# Patient Record
Sex: Male | Born: 1945 | Race: White | Hispanic: No | Marital: Married | Smoking: Never smoker
Health system: Southern US, Community
[De-identification: ages and names within clinical notes are randomized; demographics above are authoritative.]

## PROBLEM LIST (undated history)

## (undated) DIAGNOSIS — E785 Hyperlipidemia, unspecified: Secondary | ICD-10-CM

## (undated) DIAGNOSIS — I2699 Other pulmonary embolism without acute cor pulmonale: Secondary | ICD-10-CM

## (undated) DIAGNOSIS — F988 Other specified behavioral and emotional disorders with onset usually occurring in childhood and adolescence: Secondary | ICD-10-CM

## (undated) DIAGNOSIS — M1990 Unspecified osteoarthritis, unspecified site: Secondary | ICD-10-CM

## (undated) DIAGNOSIS — C439 Malignant melanoma of skin, unspecified: Secondary | ICD-10-CM

## (undated) DIAGNOSIS — I82409 Acute embolism and thrombosis of unspecified deep veins of unspecified lower extremity: Secondary | ICD-10-CM

## (undated) DIAGNOSIS — N400 Enlarged prostate without lower urinary tract symptoms: Secondary | ICD-10-CM

## (undated) DIAGNOSIS — F32A Depression, unspecified: Secondary | ICD-10-CM

## (undated) DIAGNOSIS — G3184 Mild cognitive impairment, so stated: Secondary | ICD-10-CM

## (undated) HISTORY — DX: Malignant melanoma of skin, unspecified: C43.9

## (undated) HISTORY — DX: Acute embolism and thrombosis of unspecified deep veins of unspecified lower extremity: I82.409

## (undated) HISTORY — DX: Hyperlipidemia, unspecified: E78.5

## (undated) HISTORY — DX: Mild cognitive impairment of uncertain or unknown etiology: G31.84

## (undated) HISTORY — DX: Other pulmonary embolism without acute cor pulmonale: I26.99

## (undated) HISTORY — PX: MELANOMA EXCISION: SHX5266

## (undated) HISTORY — DX: Benign prostatic hyperplasia without lower urinary tract symptoms: N40.0

## (undated) HISTORY — DX: Other specified behavioral and emotional disorders with onset usually occurring in childhood and adolescence: F98.8

## (undated) HISTORY — PX: TONSILLECTOMY: SUR1361

## (undated) HISTORY — PX: ANKLE SURGERY: SHX546

## (undated) HISTORY — DX: Depression, unspecified: F32.A

---

## 1991-11-19 HISTORY — PX: ROTATOR CUFF REPAIR: SHX139

## 1996-11-18 HISTORY — PX: CHOLECYSTECTOMY: SHX55

## 2007-09-15 ENCOUNTER — Encounter: Admission: RE | Admit: 2007-09-15 | Discharge: 2007-09-15 | Payer: Self-pay | Admitting: Orthopedic Surgery

## 2007-10-07 ENCOUNTER — Ambulatory Visit (HOSPITAL_BASED_OUTPATIENT_CLINIC_OR_DEPARTMENT_OTHER): Admission: RE | Admit: 2007-10-07 | Discharge: 2007-10-07 | Payer: Self-pay | Admitting: Orthopedic Surgery

## 2007-10-20 ENCOUNTER — Encounter: Admission: RE | Admit: 2007-10-20 | Discharge: 2007-11-10 | Payer: Self-pay | Admitting: Orthopedic Surgery

## 2011-04-02 NOTE — Op Note (Signed)
NAME:  George Blankenship, George Blankenship NO.:  [id number]   MEDICAL RECORD NO.:  [id number]          PATIENT TYPE:  AMB   LOCATION:  DSC                          FACILITY:  [facility]   PHYSICIAN:  Feliberto Gottron. Turner Daniels, M.D.   DATE OF BIRTH:  1946-06-26   DATE OF PROCEDURE:  10/07/2007  DATE OF DISCHARGE:                               OPERATIVE REPORT   PREOPERATIVE DIAGNOSIS:  Osteochondral loose bodies of the right ankle  and right elbow.   POSTOPERATIVE DIAGNOSES:  1. Osteochondral loose bodies of the right ankle and right elbow.  2. 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  3. 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   PROCEDURE:  Arthroscopic removal of loose bodies and chondromalacia from  the right ankle and the right elbow.   SURGEON:  Feliberto Gottron.  Turner Daniels, MD   FIRST ASSISTANT:  None.   ANESTHETIC:  General LMA.   ESTIMATED BLOOD LOSS:  Minimal.   FLUID REPLACEMENT:  1200 mL crystalloid.   DRAINS PLACED:  None.   TOURNIQUET TIME:  None.   INDICATIONS FOR PROCEDURE:  A very active 65 year old gentleman who is  into martial arts and I believe rodeo riding and has had multiple bumps,  contusions and injuries over the year.  He came into my office reporting  a significant right ankle pain a few weeks ago as well as right elbow  pain when he tries to go to full extension.  Plain radiographs of the  right ankle clearly showed some anterior loose bodies confirmed by CT  scan. The right elbow possibly had a loose body in the olecranon fossa.  We did not do a CT scan because of the findings of the ankle.  He was  going to be getting an anesthetic and it would be relatively simple to  go from the ankle up to the elbow and look on the olecranon fossa and  see if he did in fact have loose bodies.  The CT scan of the ankle did  show a prominent anterior osteophyte off the tibia as well as again  the  osteochondral loose body in the anterior aspect of the joint where he  was having some pain.  Risks and benefits of surgery discussed at  length, questions answered.  His family history is positive for a  postoperative MRSA infection in a daughter which ultimately ended up  killing her and because of that he has requested MRSA  prophylaxis which  was probably reasonable for both medical and psychological reasons in  this case and because of that in the preop area he did receive 1 gram of  vancomycin IV.  Risks and benefits of surgery were discussed at length,  questions answered and he is prepared for surgical intervention.   DESCRIPTION OF PROCEDURE:  The patient identified by armband and taken  to the operating room at [facility] where the appropriate  anesthetic monitors were attached and general LMA anesthesia induced  with the patient in the supine position on a beanbag.  He was then  rolled into the left lateral decubitus position.  The pelvis was  actually only partially rolled up to about 60 degrees and the shoulders  rolled up slightly past vertical to 95 degrees.  At this point the bean  bag was wrapped around him in a U fashion and the air evacuated.  This  left him in an excellent position for both the arthroscopy of the ankle  with the leg externally rotated and the arthroscopy of the elbow which  was draped over a elbow post.  Satisfied with his position tourniquets  were applied to the right calf and right arm but never used and both  extremities were then prepped and draped in usual sterile fashion from  the toes to the tourniquet to the right lower extremity and from the  tourniquet to the wrist and the right upper extremity.   The table was placed in 5 degrees of reversed Trendelenburg and the  ankle distraction clamp was then applied to the table and the ankle  distractor Holter harness applied to the ankle and traction applied.  Using an anterolateral  approach we then filled the ankle with 15 mL of  normal saline solution and made a standard anterolateral portal using an  11 blade to about one half depth.  The ankle arthroscope was then  introduced.  We immediately encountered a large osteochondral loose body  that we made photographic documentation of, but did not remove until  later in the case because when we enlarged the portal obviously we would  loose our compression of the fluid in the ankle.   The patient did in fact have large anterior osteophytes off the tibia.  These were removed with a 2.9 spherical bur.  Chondromalacia of the  distal tibial plafond was also removed with a 2.9 gator sucker shaver  from both the distal tibial plafond and also a little bit of  chondromalacia in the medial and lateral gutters along the medial  malleolus and the lateral malleolar regions.  Small bits of cartilage  were also noted the joint fluid and these were flushed out continuously.   After we had removed the anterior tibial osteophyte we documented  relatively normal cartilage in the tibiotalar joint itself and then  enlarged the anterolateral portal, bringing the scope in from the  anteromedial portal to allow whole removal of the loose body which  measured about a centimeter in diameter.  This was then placed in a  medicine cup.  The wound was irrigated out with normal saline solution  and the anterolateral portal was closed with a single 3-0 nylon  horizontal mattress suture.  A dressing of Xeroform, 4x4 dressing,  sponges, Webril and Ace wrap applied.   We then directed our attention to the right elbow and using an  anterolateral approach this joint was also filled with normal saline  solution and again making an anterolateral portal with an 11 blade to  50% depth the arthroscope was inserted and we made photographic  documentation of grade 3 to four chondromalacia of the radiocapitellar  joint which was debrided with a 2.9 gator  sucker shaver as well as an  anterior loose body which was removed with a pituitary rongeur.  We then  made  posterolateral and straight posterior portals and found two fairly  large sessile loose bodies in the olecranon fossa which would have  blocked extension which was the main  complaint with the elbow.  These  were removed with a spherical bur and the pituitary rongeurs.   We then made a supplemental posterolateral portal and visualized the  radiocapitellar joint from posterior and once again documented that it  was down to grade 4 arthritic changes and this would also be an issue  with the elbow.  Small amounts of chondromalacia were removed from this  portal as well.  At this point the elbow was irrigated out with normal  saline solution.  The arthroscopic instruments were removed.  None of  the portals were over an eighth  inch in size so none of them were actually closed.  Dressing of  Xeroform, 4x4 dressing, sponges, Webril and Ace wrap applied.  The  beanbag was deflated, he was rolled supine, awakened and taken to the  recovery room without difficulty.      Feliberto Gottron. Turner Daniels, M.D.  Electronically Signed     FJR/MEDQ  D:  10/07/2007  T:  10/08/2007  Job:  [id number]

## 2012-11-05 ENCOUNTER — Other Ambulatory Visit: Payer: Self-pay | Admitting: Orthopedic Surgery

## 2012-11-05 DIAGNOSIS — B999 Unspecified infectious disease: Secondary | ICD-10-CM

## 2012-11-06 ENCOUNTER — Ambulatory Visit
Admission: RE | Admit: 2012-11-06 | Discharge: 2012-11-06 | Disposition: A | Discharge status: Home / Self Care | Payer: Medicare Other | Source: Ambulatory Visit | Attending: Orthopedic Surgery | Admitting: Orthopedic Surgery

## 2012-11-06 DIAGNOSIS — B999 Unspecified infectious disease: Secondary | ICD-10-CM

## 2012-11-06 MED ORDER — GADOBENATE DIMEGLUMINE 529 MG/ML IV SOLN
9.0000 mL | Freq: Once | INTRAVENOUS | Status: AC | PRN
  Administered 2012-11-06: 9 mL via INTRAVENOUS

## 2012-11-27 ENCOUNTER — Ambulatory Visit: Payer: Medicare Other | Attending: Orthopedic Surgery | Admitting: Physical Therapy

## 2012-11-27 DIAGNOSIS — IMO0001 Reserved for inherently not codable concepts without codable children: Secondary | ICD-10-CM | POA: Insufficient documentation

## 2012-11-27 DIAGNOSIS — M25676 Stiffness of unspecified foot, not elsewhere classified: Secondary | ICD-10-CM | POA: Insufficient documentation

## 2012-11-27 DIAGNOSIS — M25673 Stiffness of unspecified ankle, not elsewhere classified: Secondary | ICD-10-CM | POA: Insufficient documentation

## 2012-11-27 DIAGNOSIS — R262 Difficulty in walking, not elsewhere classified: Secondary | ICD-10-CM | POA: Insufficient documentation

## 2012-11-27 DIAGNOSIS — M25579 Pain in unspecified ankle and joints of unspecified foot: Secondary | ICD-10-CM | POA: Insufficient documentation

## 2012-12-01 ENCOUNTER — Ambulatory Visit: Payer: Medicare Other | Admitting: Physical Therapy

## 2012-12-03 ENCOUNTER — Ambulatory Visit: Payer: Medicare Other | Admitting: Physical Therapy

## 2012-12-08 ENCOUNTER — Ambulatory Visit: Payer: Medicare Other | Admitting: Physical Therapy

## 2012-12-11 ENCOUNTER — Ambulatory Visit: Payer: Medicare Other | Admitting: Physical Therapy

## 2012-12-15 ENCOUNTER — Ambulatory Visit: Payer: Medicare Other | Admitting: Physical Therapy

## 2012-12-17 ENCOUNTER — Ambulatory Visit: Payer: Medicare Other | Admitting: Physical Therapy

## 2012-12-22 ENCOUNTER — Ambulatory Visit: Payer: Medicare Other | Attending: Orthopedic Surgery | Admitting: Physical Therapy

## 2012-12-22 DIAGNOSIS — IMO0001 Reserved for inherently not codable concepts without codable children: Secondary | ICD-10-CM | POA: Insufficient documentation

## 2012-12-22 DIAGNOSIS — M25579 Pain in unspecified ankle and joints of unspecified foot: Secondary | ICD-10-CM | POA: Insufficient documentation

## 2012-12-22 DIAGNOSIS — M25673 Stiffness of unspecified ankle, not elsewhere classified: Secondary | ICD-10-CM | POA: Insufficient documentation

## 2012-12-22 DIAGNOSIS — R262 Difficulty in walking, not elsewhere classified: Secondary | ICD-10-CM | POA: Insufficient documentation

## 2012-12-22 DIAGNOSIS — M25676 Stiffness of unspecified foot, not elsewhere classified: Secondary | ICD-10-CM | POA: Insufficient documentation

## 2012-12-24 ENCOUNTER — Ambulatory Visit: Payer: Medicare Other | Admitting: Physical Therapy

## 2012-12-29 ENCOUNTER — Ambulatory Visit: Payer: Medicare Other | Admitting: Physical Therapy

## 2012-12-31 ENCOUNTER — Ambulatory Visit: Payer: Medicare Other | Admitting: Physical Therapy

## 2013-01-12 ENCOUNTER — Ambulatory Visit: Payer: Medicare Other | Admitting: Physical Therapy

## 2013-01-18 ENCOUNTER — Ambulatory Visit: Payer: Medicare Other | Attending: Orthopedic Surgery | Admitting: Physical Therapy

## 2013-01-18 DIAGNOSIS — IMO0001 Reserved for inherently not codable concepts without codable children: Secondary | ICD-10-CM | POA: Insufficient documentation

## 2013-01-18 DIAGNOSIS — M25676 Stiffness of unspecified foot, not elsewhere classified: Secondary | ICD-10-CM | POA: Insufficient documentation

## 2013-01-18 DIAGNOSIS — M25673 Stiffness of unspecified ankle, not elsewhere classified: Secondary | ICD-10-CM | POA: Insufficient documentation

## 2013-01-18 DIAGNOSIS — M25579 Pain in unspecified ankle and joints of unspecified foot: Secondary | ICD-10-CM | POA: Insufficient documentation

## 2013-01-18 DIAGNOSIS — R262 Difficulty in walking, not elsewhere classified: Secondary | ICD-10-CM | POA: Insufficient documentation

## 2013-01-20 ENCOUNTER — Ambulatory Visit: Payer: Medicare Other | Admitting: Physical Therapy

## 2013-01-25 ENCOUNTER — Ambulatory Visit: Payer: Medicare Other | Admitting: Physical Therapy

## 2013-01-27 ENCOUNTER — Ambulatory Visit: Payer: Medicare Other | Admitting: Physical Therapy

## 2013-02-01 ENCOUNTER — Ambulatory Visit: Payer: Medicare Other | Admitting: Physical Therapy

## 2013-02-03 ENCOUNTER — Ambulatory Visit: Payer: Medicare Other | Admitting: Physical Therapy

## 2013-02-10 ENCOUNTER — Ambulatory Visit: Payer: Medicare Other | Admitting: Physical Therapy

## 2013-02-12 ENCOUNTER — Ambulatory Visit: Payer: Medicare Other | Admitting: Physical Therapy

## 2013-02-15 ENCOUNTER — Ambulatory Visit: Payer: Medicare Other | Admitting: Physical Therapy

## 2013-02-17 ENCOUNTER — Ambulatory Visit: Payer: Medicare Other | Admitting: Physical Therapy

## 2014-11-30 ENCOUNTER — Emergency Department (HOSPITAL_COMMUNITY): Payer: Commercial Managed Care - HMO

## 2014-11-30 ENCOUNTER — Encounter (HOSPITAL_COMMUNITY): Payer: Self-pay | Admitting: *Deleted

## 2014-11-30 ENCOUNTER — Inpatient Hospital Stay (HOSPITAL_COMMUNITY)
Admission: EM | Admit: 2014-11-30 | Discharge: 2014-12-02 | DRG: 176 | Disposition: A | Discharge status: Home / Self Care | Payer: Commercial Managed Care - HMO | Attending: Internal Medicine | Admitting: Internal Medicine

## 2014-11-30 DIAGNOSIS — I2699 Other pulmonary embolism without acute cor pulmonale: Secondary | ICD-10-CM

## 2014-11-30 DIAGNOSIS — Z888 Allergy status to other drugs, medicaments and biological substances status: Secondary | ICD-10-CM

## 2014-11-30 DIAGNOSIS — I82402 Acute embolism and thrombosis of unspecified deep veins of left lower extremity: Secondary | ICD-10-CM

## 2014-11-30 DIAGNOSIS — R0609 Other forms of dyspnea: Secondary | ICD-10-CM

## 2014-11-30 DIAGNOSIS — E875 Hyperkalemia: Secondary | ICD-10-CM | POA: Diagnosis present

## 2014-11-30 DIAGNOSIS — M1990 Unspecified osteoarthritis, unspecified site: Secondary | ICD-10-CM | POA: Diagnosis present

## 2014-11-30 DIAGNOSIS — R0602 Shortness of breath: Secondary | ICD-10-CM

## 2014-11-30 DIAGNOSIS — R0600 Dyspnea, unspecified: Secondary | ICD-10-CM

## 2014-11-30 HISTORY — DX: Unspecified osteoarthritis, unspecified site: M19.90

## 2014-11-30 LAB — CBC WITH DIFFERENTIAL/PLATELET
BASOS PCT: 1 % (ref 0–1)
Basophils Absolute: 0.1 10*3/uL (ref 0.0–0.1)
EOS ABS: 0.2 10*3/uL (ref 0.0–0.7)
Eosinophils Relative: 2 % (ref 0–5)
HCT: 45.1 % (ref 39.0–52.0)
Hemoglobin: 15.6 g/dL (ref 13.0–17.0)
LYMPHS ABS: 2.6 10*3/uL (ref 0.7–4.0)
Lymphocytes Relative: 26 % (ref 12–46)
MCH: 30.1 pg (ref 26.0–34.0)
MCHC: 34.6 g/dL (ref 30.0–36.0)
MCV: 87.1 fL (ref 78.0–100.0)
Monocytes Absolute: 0.9 10*3/uL (ref 0.1–1.0)
Monocytes Relative: 10 % (ref 3–12)
NEUTROS ABS: 6.2 10*3/uL (ref 1.7–7.7)
NEUTROS PCT: 61 % (ref 43–77)
PLATELETS: 201 10*3/uL (ref 150–400)
RBC: 5.18 MIL/uL (ref 4.22–5.81)
RDW: 13.5 % (ref 11.5–15.5)
WBC: 9.9 10*3/uL (ref 4.0–10.5)

## 2014-11-30 LAB — I-STAT TROPONIN, ED: Troponin i, poc: 0 ng/mL (ref 0.00–0.08)

## 2014-11-30 LAB — CBC
HCT: 41.1 % (ref 39.0–52.0)
Hemoglobin: 14.5 g/dL (ref 13.0–17.0)
MCH: 30.3 pg (ref 26.0–34.0)
MCHC: 35.3 g/dL (ref 30.0–36.0)
MCV: 86 fL (ref 78.0–100.0)
Platelets: 187 10*3/uL (ref 150–400)
RBC: 4.78 MIL/uL (ref 4.22–5.81)
RDW: 13.4 % (ref 11.5–15.5)
WBC: 9.3 10*3/uL (ref 4.0–10.5)

## 2014-11-30 LAB — BASIC METABOLIC PANEL
Anion gap: 9 (ref 5–15)
BUN: 14 mg/dL (ref 6–23)
CALCIUM: 9.5 mg/dL (ref 8.4–10.5)
CHLORIDE: 106 meq/L (ref 96–112)
CO2: 24 mmol/L (ref 19–32)
CREATININE: 1.16 mg/dL (ref 0.50–1.35)
GFR calc Af Amer: 73 mL/min — ABNORMAL LOW (ref >90)
GFR calc non Af Amer: 63 mL/min — ABNORMAL LOW (ref >90)
GLUCOSE: 95 mg/dL (ref 70–99)
Potassium: 5.3 mmol/L — ABNORMAL HIGH (ref 3.5–5.1)
Sodium: 139 mmol/L (ref 135–145)

## 2014-11-30 LAB — BRAIN NATRIURETIC PEPTIDE: B NATRIURETIC PEPTIDE 5: 114 pg/mL — AB (ref 0.0–100.0)

## 2014-11-30 LAB — D-DIMER, QUANTITATIVE (NOT AT ARMC): D DIMER QUANT: 3.85 ug{FEU}/mL — AB (ref 0.00–0.48)

## 2014-11-30 LAB — SEDIMENTATION RATE: Sed Rate: 3 mm/hr (ref 0–16)

## 2014-11-30 MED ORDER — OMEGA-3-ACID ETHYL ESTERS 1 G PO CAPS
1.0000 g | ORAL_CAPSULE | Freq: Every day | ORAL | Status: DC
  Administered 2014-12-02: 1 g via ORAL
  Filled 2014-11-30 (×2): qty 1

## 2014-11-30 MED ORDER — SODIUM CHLORIDE 0.9 % IJ SOLN
3.0000 mL | Freq: Two times a day (BID) | INTRAMUSCULAR | Status: DC
  Administered 2014-11-30: 3 mL via INTRAVENOUS

## 2014-11-30 MED ORDER — ACETAMINOPHEN 325 MG PO TABS: 650.0000 mg | ORAL_TABLET | Freq: Four times a day (QID) | ORAL | Status: DC | PRN

## 2014-11-30 MED ORDER — HEPARIN BOLUS VIA INFUSION
5000.0000 [IU] | Freq: Once | INTRAVENOUS | Status: AC
  Administered 2014-11-30: 5000 [IU] via INTRAVENOUS
  Filled 2014-11-30: qty 5000

## 2014-11-30 MED ORDER — MELOXICAM 7.5 MG PO TABS
7.5000 mg | ORAL_TABLET | Freq: Every day | ORAL | Status: DC
  Filled 2014-11-30 (×2): qty 1

## 2014-11-30 MED ORDER — HEPARIN (PORCINE) IN NACL 100-0.45 UNIT/ML-% IJ SOLN
1250.0000 [IU]/h | INTRAMUSCULAR | Status: DC
  Administered 2014-11-30: 1450 [IU]/h via INTRAVENOUS
  Administered 2014-12-01: 1250 [IU]/h via INTRAVENOUS
  Filled 2014-11-30 (×3): qty 250

## 2014-11-30 MED ORDER — ACETAMINOPHEN 650 MG RE SUPP: 650.0000 mg | Freq: Four times a day (QID) | RECTAL | Status: DC | PRN

## 2014-11-30 MED ORDER — TRAZODONE HCL 50 MG PO TABS
50.0000 mg | ORAL_TABLET | Freq: Once | ORAL | Status: DC
  Filled 2014-11-30: qty 1

## 2014-11-30 MED ORDER — IOHEXOL 350 MG/ML SOLN
100.0000 mL | Freq: Once | INTRAVENOUS | Status: AC | PRN
  Administered 2014-11-30: 100 mL via INTRAVENOUS

## 2014-11-30 NOTE — Consult Note (Signed)
Name: George Blankenship MRN: [id number] DOB: February 04, 1946    ADMISSION DATE:  11/30/2014 CONSULTATION DATE:  11/30/2014  REFERRING MD :  Dhungel  CHIEF COMPLAINT:  SOB  BRIEF PATIENT DESCRIPTION: 69 y.o. M brought to [facility] ED 01/13 for SOB.  Sent to ED from PCP's office because was hypoxic with SpO2 91%.  In ED, CTA revealed b/l PE's.  PCCM consulted for recs.  SIGNIFICANT EVENTS  01/13 - admitted with PE  STUDIES:  CTA 01/13 >>> pos for b/l PE with CT evidence of right heart strain (RV/LV ratio 0.95). LE doppllers 01/13 >>> preliminary result positive in left distal popliteal and posterior tibial veins. Echo 01/13 >>>   HISTORY OF PRESENT ILLNESS:  George Blankenship is a 69 y.o. M with no significant PMH who presented to [facility] ED 01/13 for SOB.  He went to his PCP and had SpO2 of 91% so was sent to ED for further evaluation.   In ED, CTA revealed b/l PE's with CT evidence of right heart strain (RV/LV ratio 0.95). Pt reports that he had a recent plane trip to [place] (Jan 4).  Pt was seated for the duration of the 6 hour flight.  Upon returning home, he noticed bilateral calves to be cramping randomly throughout the day.  His dyspnea began 4 days after his flight. He denies any hx of smoking, hemoptysis, hx of cancers, clotting disorders, hormone supplementation.  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  His SOB is relieved with rest, aggravated with exertion.  Denies any chest pain or LE swelling.  In ED, he remains hemodynamically stable with excellent BP and O2 saturation (99% on RA).  PAST MEDICAL HISTORY :   has no past medical history on file.  has no past surgical history on file. Prior to Admission medications   Medication Sig Start Date End Date Taking? Authorizing  Provider  meloxicam (MOBIC) 7.5 MG tablet Take 7.5 mg by mouth daily.   Yes Historical Provider, MD  Omega-3 Fatty Acids (FISH OIL) 1000 MG CAPS Take 1,000 mg by mouth daily.   Yes Historical Provider, MD   Allergies  Allergen Reactions  . Statins     Muscle aches  . Lamisil [Terbinafine Hcl] Rash    FAMILY HISTORY:  family history is not on file. SOCIAL HISTORY:  reports that he does not drink alcohol or use illicit drugs.  REVIEW OF SYSTEMS:   All negative; except for those that are bolded, which indicate positives.  Constitutional: weight loss, weight gain, night sweats, fevers, chills, fatigue, weakness.  HEENT: headaches, sore throat, sneezing, nasal congestion, post nasal drip, difficulty swallowing, tooth/dental problems, visual complaints, visual changes, ear aches. Neuro: difficulty with speech, weakness, numbness, ataxia. CV:  chest pain, orthopnea, PND, swelling in lower extremities, dizziness, palpitations, syncope.  Resp: cough, hemoptysis, dyspnea, wheezing. GI  heartburn, indigestion, abdominal pain, nausea, vomiting, diarrhea, constipation, change in bowel habits, loss of appetite, hematemesis, melena, hematochezia.  GU: dysuria, change in color of urine, urgency or frequency, flank pain, hematuria. MSK: joint pain or swelling, decreased range of motion, cramping in b/l calves. Psych: change in mood or affect, depression, anxiety, suicidal ideations, homicidal ideations. Skin: rash, itching, bruising.   SUBJECTIVE:  No  dyspnea at rest.  Denies chest pain.  Hemodynamically stable.  VITAL SIGNS: Temp:  [97.4 F (36.3 C)] 97.4 F (36.3 C) (01/13 1143) Pulse Rate:  [68-80] 80 (01/13 1730) Resp:  [16-18] 16 (01/13 1600) BP: (105-155)/(69-95) 105/73 mmHg (01/13 1730) SpO2:  [96 %-100 %] 99 % (01/13 1730) Weight:  [83.915 kg (185 lb)] 83.915 kg (185 lb) (01/13 1717)  PHYSICAL EXAMINATION: General: Pleasant male, resting in bed, in NAD. Neuro: A&O x 3, non-focal.   HEENT: [place]/AT. PERRL, sclerae anicteric. Cardiovascular: RRR, no M/R/G.  Lungs: Respirations even and unlabored.  CTA bilaterally, No W/R/R. Abdomen: BS x 4, soft, NT/ND.  Musculoskeletal: No gross deformities, no edema.  Skin: Intact, warm, no rashes.   Recent Labs Lab 11/30/14 1151  NA 139  K 5.3*  CL 106  CO2 24  BUN 14  CREATININE 1.16  GLUCOSE 95    Recent Labs Lab 11/30/14 1151  HGB 15.6  HCT 45.1  WBC 9.9  PLT 201   Dg Chest 2 View  11/30/2014   CLINICAL DATA:  Shortness of breath, worsening over the last 2-3 days.  EXAM: CHEST  2 VIEW  COMPARISON:  11/28/2014  FINDINGS: The heart size and mediastinal contours are within normal limits. Both lungs are clear. The visualized skeletal structures are unremarkable.  IMPRESSION: No active cardiopulmonary disease.   Electronically Signed   By: Rolm Baptise M.D.   On: 11/30/2014 12:51   Ct Angio Chest Pe W/cm &/or Wo Cm  11/30/2014   CLINICAL DATA:  Dyspnea on exertion.  Recent 7 hr flight.  EXAM: CT ANGIOGRAPHY CHEST WITH CONTRAST  TECHNIQUE: Multidetector CT imaging of the chest was performed using the standard protocol during bolus administration of intravenous contrast. Multiplanar CT image reconstructions and MIPs were obtained to evaluate the vascular anatomy.  CONTRAST:  143mL OMNIPAQUE IOHEXOL 350 MG/ML SOLN  COMPARISON:  Chest radiographs 11/30/2014  FINDINGS: Pulmonary emboli are present in the proximal right upper lobe artery, proximal right lower lobe artery, lingular branches, left lower lobe lobar, segmental, and subsegmental branches. Subsegmental right lower lobe branches are also involved. RV/ LV ratio is 0.95 and the intraventricular septum mildly does into the left ventricle. Heart is within normal limits for size. Three-vessel coronary artery calcification is noted.  No enlarged axillary, mediastinal, or hilar lymph nodes are identified. There is trace pericardial fluid/ thickening. There is no pleural effusion.  Minimal dependent atelectasis is present in the lower lobes. Major airways are patent.  Visualized portion of the upper abdomen demonstrates prior cholecystectomy. Mild thoracic spondylosis is noted.  Review of the MIP images confirms the above findings.  IMPRESSION: 1. Positive for acute PE with CT evidence of right heartstrain (RV/LV Ratio = 0.95) consistent with at least submassive (intermediate risk) PE. The presence of right heart strain has been associated with an increased risk of morbidity and mortality. Consultation with Pulmonary and Critical Care Medicine is recommended. 2. Three-vessel coronary artery calcification. Critical Value/emergent results were called by telephone at the time of interpretation on 11/30/2014 at 5:05 pm to Dr. Winfred Leeds, who verbally acknowledged these results.   Electronically Signed   By: Logan Bores   On: 11/30/2014 17:06    ASSESSMENT / PLAN:  Bilateral pulmonary emboli - emboli noted to be more distal. Recs: Supplemental O2 as needed to maintain SpO2 > 92%. Systemic heparin with either coumadin bridge, or other NOAC (pt appears to be good candidate for NOAC). No role tPA here given full hemodynamic stability /  risk - benefit ratio. F/u on echo. F/u on LE dopplers (formal read).  Will follow echo results.  If no significant right heart strain and pt continues to remain hemodynamically stable, then PCCM will sign off.   Montey Hora, [place] [facility] Pgr: [phone]  or [phone] 11/30/2014, 6:29 PM    Reviewed above, examined.  69 yo male went on plane flight to/from [facility].  Shortly after he notices more trouble with his breathing >> he is fairly active otherwise.  This became progressively worse, and he was advised by his PCP to come to hospital.  D dimer was elevated, and CT chest showed b/l PE with borderline elevation in his RV/LV ratio.  His doppler leg is positive for DVT in Lt lower leg.  His  troponin is negative and Echo results are pending.  He is quite comfortable at rest.  He denies chest pain or dyspnea.  His lungs sounds are clear and heart rate regular.  He is currently on heparin gtt.  Can transition to oral anticoagulant at discretion of hospitalist.  I had extensive conversation with him about options for oral anticoagulants.  His risk of bleeding is low.  As such I have recommended using one of the direct oral anticoagulants (DOAC).  He will get further literature from pharmacist.  There is no indication for thrombolytic therapy (either systemic, or catheter directed) at this time.  He likely had provoked clot in setting of long plane flight.  He should remain on anticoagulation for at least 3 months.  He should then be re-assessed for his risk of clot recurrence, and then decide whether to continue oral anticoagulants or transition to aspirin therapy.    PCCM will sign off.  Please call if additional help is needed.  Chesley Mires, MD [facility] Pulmonary/Critical Care 11/30/2014, 9:08 PM Pager:  [phone] After 3pm call: [phone]

## 2014-11-30 NOTE — ED Notes (Signed)
Pt reports having sob on exertion x 1 week. Denies recent cough or swelling to extremities. Went to pcp today and was sent here due to spo2 91%.

## 2014-11-30 NOTE — ED Provider Notes (Signed)
Patient care acquired from [facility], PA-C pending CT angio chest for PE r/o with plan to admit for dyspnea on exertion with new hypoxia while ambulating.   Results for orders placed or performed during the hospital encounter of 11/30/14  CBC with Differential  Result Value Ref Range   WBC 9.9 4.0 - 10.5 K/uL   RBC 5.18 4.22 - 5.81 MIL/uL   Hemoglobin 15.6 13.0 - 17.0 g/dL   HCT 45.1 39.0 - 52.0 %   MCV 87.1 78.0 - 100.0 fL   MCH 30.1 26.0 - 34.0 pg   MCHC 34.6 30.0 - 36.0 g/dL   RDW 13.5 11.5 - 15.5 %   Platelets 201 150 - 400 K/uL   Neutrophils Relative % 61 43 - 77 %   Neutro Abs 6.2 1.7 - 7.7 K/uL   Lymphocytes Relative 26 12 - 46 %   Lymphs Abs 2.6 0.7 - 4.0 K/uL   Monocytes Relative 10 3 - 12 %   Monocytes Absolute 0.9 0.1 - 1.0 K/uL   Eosinophils Relative 2 0 - 5 %   Eosinophils Absolute 0.2 0.0 - 0.7 K/uL   Basophils Relative 1 0 - 1 %   Basophils Absolute 0.1 0.0 - 0.1 K/uL  Basic metabolic panel  Result Value Ref Range   Sodium 139 135 - 145 mmol/L   Potassium 5.3 (H) 3.5 - 5.1 mmol/L   Chloride 106 96 - 112 mEq/L   CO2 24 19 - 32 mmol/L   Glucose, Bld 95 70 - 99 mg/dL   BUN 14 6 - 23 mg/dL   Creatinine, Ser 1.16 0.50 - 1.35 mg/dL   Calcium 9.5 8.4 - 10.5 mg/dL   GFR calc non Af Amer 63 (L) >90 mL/min   GFR calc Af Amer 73 (L) >90 mL/min   Anion gap 9 5 - 15  Brain natriuretic peptide  Result Value Ref Range   B Natriuretic Peptide 114.0 (H) 0.0 - 100.0 pg/mL  D-dimer, quantitative  Result Value Ref Range   D-Dimer, Quant 3.85 (H) 0.00 - 0.48 ug/mL-FEU  I-stat troponin, ED  Result Value Ref Range   Troponin i, poc 0.00 0.00 - 0.08 ng/mL   Comment 3           Dg Chest 2 View  11/30/2014   CLINICAL DATA:  Shortness of breath, worsening over the last 2-3 days.  EXAM: CHEST  2 VIEW  COMPARISON:  11/28/2014  FINDINGS: The heart size and mediastinal contours are within normal limits. Both lungs are clear. The visualized skeletal structures are  unremarkable.  IMPRESSION: No active cardiopulmonary disease.   Electronically Signed   By: Rolm Baptise M.D.   On: 11/30/2014 12:51   Ct Angio Chest Pe W/cm &/or Wo Cm  11/30/2014   CLINICAL DATA:  Dyspnea on exertion.  Recent 7 hr flight.  EXAM: CT ANGIOGRAPHY CHEST WITH CONTRAST  TECHNIQUE: Multidetector CT imaging of the chest was performed using the standard protocol during bolus administration of intravenous contrast. Multiplanar CT image reconstructions and MIPs were obtained to evaluate the vascular anatomy.  CONTRAST:  190mL OMNIPAQUE IOHEXOL 350 MG/ML SOLN  COMPARISON:  Chest radiographs 11/30/2014  FINDINGS: Pulmonary emboli are present in the proximal right upper lobe artery, proximal right lower lobe artery, lingular branches, left lower lobe lobar, segmental, and subsegmental branches. Subsegmental right lower lobe branches are also involved. RV/ LV ratio is 0.95 and the intraventricular septum mildly does into the left ventricle. Heart is within normal limits  for size. Three-vessel coronary artery calcification is noted.  No enlarged axillary, mediastinal, or hilar lymph nodes are identified. There is trace pericardial fluid/ thickening. There is no pleural effusion. Minimal dependent atelectasis is present in the lower lobes. Major airways are patent.  Visualized portion of the upper abdomen demonstrates prior cholecystectomy. Mild thoracic spondylosis is noted.  Review of the MIP images confirms the above findings.  IMPRESSION: 1. Positive for acute PE with CT evidence of right heartstrain (RV/LV Ratio = 0.95) consistent with at least submassive (intermediate risk) PE. The presence of right heart strain has been associated with an increased risk of morbidity and mortality. Consultation with Pulmonary and Critical Care Medicine is recommended. 2. Three-vessel coronary artery calcification. Critical Value/emergent results were called by telephone at the time of interpretation on 11/30/2014 at 5:05  pm to Dr. Winfred Leeds, who verbally acknowledged these results.   Electronically Signed   By: Logan Bores   On: 11/30/2014 17:06   Medications  heparin bolus via infusion 5,000 Units (not administered)  heparin ADULT infusion 100 units/mL (25000 units/250 mL) (1,450 Units/hr Intravenous New Bag/Given 11/30/14 1924)  sodium chloride 0.9 % injection 3 mL (not administered)  acetaminophen (TYLENOL) tablet 650 mg (not administered)    Or  acetaminophen (TYLENOL) suppository 650 mg (not administered)  meloxicam (MOBIC) tablet 7.5 mg (not administered)  omega-3 acid ethyl esters (LOVAZA) capsule 1 g (not administered)  iohexol (OMNIPAQUE) 350 MG/ML injection 100 mL (100 mLs Intravenous Contrast Given 11/30/14 1642)    CRITICAL CARE Performed by: Baron Sane L   Total critical care time: 35 minutes  Critical care time was exclusive of separately billable procedures and treating other patients.  Critical care was necessary to treat or prevent imminent or life-threatening deterioration.  Critical care was time spent personally by me on the following activities: development of treatment plan with patient and/or surrogate as well as nursing, discussions with consultants, evaluation of patient's response to treatment, examination of patient, obtaining history from patient or surrogate, ordering and performing treatments and interventions, ordering and review of laboratory studies, ordering and review of radiographic studies, pulse oximetry and re-evaluation of patient's condition.   1. Pulmonary embolism   2. SOB (shortness of breath)   3. Dyspnea on exertion    Filed Vitals:   11/30/14 1925  BP: 131/84  Pulse: 71  Temp: 97.8 F (36.6 C)  Resp:    I have reviewed nursing notes, vital signs, and all appropriate lab and imaging results for this patient. Discussed results with patient and family. Patient presenting with dyspnea on exertion. He is hypoxic on ambulation. Vitals are  stable otherwise. CT scan obtained with evidence of right-sided PE.  Patient will be admitted to [facility] for further management and evaluation of PE. Heparin ordered. Patient d/w with Dr. Winfred Leeds, agrees with plan.    Harlow Mares, PA-C 11/30/14 [place], MD 11/30/14 2009

## 2014-11-30 NOTE — Progress Notes (Addendum)
ANTICOAGULATION CONSULT NOTE - Initial Consult  Pharmacy Consult for Heparin  Indication: pulmonary embolus  Allergies  Allergen Reactions  . Statins     Muscle aches  . Lamisil [Terbinafine Hcl] Rash    Patient Measurements:  Vital Signs: Temp: 97.4 F (36.3 C) (01/13 1143) Temp Source: Oral (01/13 1143) BP: 133/80 mmHg (01/13 1700) Pulse Rate: 78 (01/13 1700)  Labs:  Recent Labs  11/30/14 1151  HGB 15.6  HCT 45.1  PLT 201  CREATININE 1.16    CrCl cannot be calculated (Unknown ideal weight.).   Medical History: History reviewed. No pertinent past medical history.  Medications: None Assessment:  37 YOM presenting to MED on 11/30/14 c/o SOB on exertion x 1 week.  Patient reports two long plane flights on 11/01/14 & 11/21/14 in which calves began to cramp.  D-dimer elevated.  CT chest revealed positive for acute PE with evidence of right heart strain.   Pharmacy has been consulted to dose heparin in a setting of PE.  CBC wnl.  No bleeding noted.  Goal of Therapy:  Heparin level 0.3-0.7 units/ml Monitor platelets by anticoagulation protocol: Yes   Plan:  - Heparin 5000 unit IV bolus - Heparin 1450 units/hr IV infusion - Daily CBC & Heparin level - Follow up heparin level 6 hours after start of infusion - Monitor for signs and symptoms of bleeding  Hassie Bruce, Pharm. D. Clinical Pharmacy Resident Pager: [phone] Ph: [phone] 11/30/2014 7:43 PM

## 2014-11-30 NOTE — Progress Notes (Signed)
*  PRELIMINARY RESULTS* Vascular Ultrasound Lower extremity venous duplex has been completed.  Preliminary findings: Right = no evidence of DVT. Left = DVT involving the distal popliteal vein and proximal posterior tibial veins.    Landry Mellow, RDMS, RVT  11/30/2014, 6:18 PM

## 2014-11-30 NOTE — H&P (Signed)
[facility] History and Physical                                                                                    Patient Demographics  George Blankenship, is a 69 y.o. male  MRN: [id number]   DOB - 1946-01-26  Admit Date - 11/30/2014  Outpatient Primary MD for the patient is Gennette Pac, MD      Chief Complaint  Patient presents with  . Shortness of Breath     HPI  George Blankenship  is a 69 y.o. male, with a past medical history of remote ankle surgery, presents to the ER today for dyspnea on exertion.  The patient explains that he took a recent trip to [place]. He flew home on January 4. It was a 6 hour flight during which he did not move from his seat because he was sitting next to a disabled individual in a wheelchair and did not want inconvenience them. Over the past week he has noticed increasing dyspnea on exertion. 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 George Blankenship is not obese, he has had no recent surgery, there is no history of blood clots in his past or in his family. CT angio chest in the ER showed submassive bilateral pulmonary emboli with right heart strain.  George Blankenship denies chest pain, shortness of breath at rest, palpitations, recent illness, and cough. In general he reports feeling fine with the exception of dyspnea on exertion.  Review of Systems    In addition to the HPI above,  No Fever-chills, No Headache, No changes with Vision or hearing, No problems swallowing food or Liquids, No Chest pain, Cough  No Abdominal pain, No Nausea or Vomiting, Bowel movements are regular, No new skin rashes or bruises, No new joints pains-aches, + he occasionally has swelling in his ankle and wears compression hose to prevent this No new weakness, tingling, numbness in any extremity, No recent weight gain or  loss, No polyuria, polydypsia or polyphagia,   A full 10 point Review of Systems was done, except as stated above, all other Review of Systems were negative.   Social History History  Substance Use Topics  . Smoking status: Not on file  . Smokeless tobacco: Not on file  . Alcohol Use: No   He denies any tobacco use. He reports drinking 1-2 glasses of wine approximately 3 times a week.  Family History History reviewed. No pertinent family history.   Prior to Admission medications   Medication Sig Start Date End Date Taking? Authorizing Provider  meloxicam (MOBIC) 7.5 MG tablet Take 7.5 mg by mouth daily.   Yes Historical Provider, MD  Omega-3 Fatty Acids (FISH OIL) 1000 MG CAPS Take 1,000 mg by mouth daily.   Yes Historical Provider, MD    Allergies  Allergen Reactions  . Statins     Muscle aches  . Lamisil [Terbinafine Hcl] Rash    Physical Exam  Vitals  Blood pressure 105/73, pulse 80, temperature 97.4 F (  36.3 C), temperature source Oral, resp. rate 16, height 6\' 1"  (1.854 m), weight 83.915 kg (185 lb), SpO2 99 %.   General:  lying in bed in NAD, well-developed, well-nourished, pleasant. Wife at bedside. Appears stable.  Psych:  Normal affect and insight, Not Suicidal or Homicidal, Awake Alert, Oriented X 3.  Neuro:   No F.N deficits, ALL C.Nerves Intact, Strength 5/5 all 4 extremities, Sensation intact all 4 extremities.  ENT:  Ears and Eyes appear Normal, Conjunctivae clear, PERR Moist Oral Mucosa.  Neck:  Supple Neck, No JVD, No cervical lymphadenopathy appreciated  Respiratory:  Symmetrical Chest wall movement, Good air movement bilaterally, CTAB.  Cardiac:  RRR, No Gallops, Rubs or Murmurs, No Parasternal Heave.  Abdomen:  Positive Bowel Sounds, Abdomen Soft, Non tender, No organomegaly appreciated  Skin:  No Cyanosis, Normal Skin Turgor, No Skin Rash or Bruise.  Extremities:  Good muscle tone,  joints appear normal , no effusions, Normal ROM. No cord  appreciated in posterior lower extremities.   Data Review  CBC  Recent Labs Lab 11/30/14 1151  WBC 9.9  HGB 15.6  HCT 45.1  PLT 201  MCV 87.1  MCH 30.1  MCHC 34.6  RDW 13.5  LYMPHSABS 2.6  MONOABS 0.9  EOSABS 0.2  BASOSABS 0.1   ------------------------------------------------------------------------------------------------------------------  Chemistries   Recent Labs Lab 11/30/14 1151  NA 139  K 5.3*  CL 106  CO2 24  GLUCOSE 95  BUN 14  CREATININE 1.16  CALCIUM 9.5   -------------------------------------------------------------------------------------------------------------  Recent Labs  11/30/14 1326  DDIMER 3.85*    Imaging results:   Dg Chest 2 View  11/30/2014   CLINICAL DATA:  Shortness of breath, worsening over the last 2-3 days.  EXAM: CHEST  2 VIEW  COMPARISON:  11/28/2014  FINDINGS: The heart size and mediastinal contours are within normal limits. Both lungs are clear. The visualized skeletal structures are unremarkable.  IMPRESSION: No active cardiopulmonary disease.   Electronically Signed   By: Rolm Baptise M.D.   On: 11/30/2014 12:51   Ct Angio Chest Pe W/cm &/or Wo Cm  11/30/2014   CLINICAL DATA:  Dyspnea on exertion.  Recent 7 hr flight.  EXAM: CT ANGIOGRAPHY CHEST WITH CONTRAST  TECHNIQUE: Multidetector CT imaging of the chest was performed using the standard protocol during bolus administration of intravenous contrast. Multiplanar CT image reconstructions and MIPs were obtained to evaluate the vascular anatomy.  CONTRAST:  157mL OMNIPAQUE IOHEXOL 350 MG/ML SOLN  COMPARISON:  Chest radiographs 11/30/2014  FINDINGS: Pulmonary emboli are present in the proximal right upper lobe artery, proximal right lower lobe artery, lingular branches, left lower lobe lobar, segmental, and subsegmental branches. Subsegmental right lower lobe branches are also involved. RV/ LV ratio is 0.95 and the intraventricular septum mildly does into the left ventricle. Heart  is within normal limits for size. Three-vessel coronary artery calcification is noted.  No enlarged axillary, mediastinal, or hilar lymph nodes are identified. There is trace pericardial fluid/ thickening. There is no pleural effusion. Minimal dependent atelectasis is present in the lower lobes. Major airways are patent.  Visualized portion of the upper abdomen demonstrates prior cholecystectomy. Mild thoracic spondylosis is noted.  Review of the MIP images confirms the above findings.  IMPRESSION: 1. Positive for acute PE with CT evidence of right heartstrain (RV/LV Ratio = 0.95) consistent with at least submassive (intermediate risk) PE. The presence of right heart strain has been associated with an increased risk of morbidity and mortality. Consultation with Pulmonary and  Critical Care Medicine is recommended. 2. Three-vessel coronary artery calcification. Critical Value/emergent results were called by telephone at the time of interpretation on 11/30/2014 at 5:05 pm to Dr. Winfred Leeds, who verbally acknowledged these results.   Electronically Signed   By: Logan Bores   On: 11/30/2014 17:06    My personal review of EKG: Sinus rhythm with first-degree block.    Assessment & Plan  Principal Problem:   Acute pulmonary embolism Active Problems:   Hyperkalemia   Acute bilateral pulmonary emboli with right heart strain Critical care pulmonary medicine consulted. They will evaluate the patient. We appreciate the recommendations Patient has been placed on a heparin drip per pharmacy. We will likely convert him to oral anticoagulation prior to discharge.  2-D echocardiogram and bilateral lower extremity Doppler ultrasound has been ordered  patient is being admitted to a telemetry bed. We will ask case management for assistance in determining the cost of NOAC.  Mild hyperkalemia (5.3) Will check a bmet in the a.m.    DVT Prophylaxis:  Currently on heparin drip per pharmacy  AM Labs Ordered, also  please review Full Orders  Family Communication:   Wife at bedside   Code Status:  full  Likely DC to home  Condition:  guarded  Time spent in minutes : 60    York, Bobby Rumpf PA-C on 11/30/2014 at 6:10 PM  Between 7am to 7pm - Pager - [phone]  After 7pm go to [email or web address] - password [email or web address]  And look for the night coverage person covering me after hours  [facility] Office  [phone]

## 2014-11-30 NOTE — ED Provider Notes (Signed)
Medical screening examination/treatment/procedure(s) were conducted as a shared visit with non-physician practitioner(s) and myself.  I personally evaluated the patient during the encounter.   EKG Interpretation   Date/Time:  Wednesday November 30 2014 11:42:09 EST Ventricular Rate:  76 PR Interval:  218 QRS Duration: 102 QT Interval:  372 QTC Calculation: 418 R Axis:   97 Text Interpretation:  Sinus rhythm with 1st degree A-V block Possible Left  atrial enlargement Rightward axis Nonspecific T wave abnormality Abnormal  ECG Confirmed by Fatimata Talsma  MD, Venancio Chenier [phone]) on 11/30/2014 1:51:41 PM      Results for orders placed or performed during the hospital encounter of 11/30/14  CBC with Differential  Result Value Ref Range   WBC 9.9 4.0 - 10.5 K/uL   RBC 5.18 4.22 - 5.81 MIL/uL   Hemoglobin 15.6 13.0 - 17.0 g/dL   HCT 45.1 39.0 - 52.0 %   MCV 87.1 78.0 - 100.0 fL   MCH 30.1 26.0 - 34.0 pg   MCHC 34.6 30.0 - 36.0 g/dL   RDW 13.5 11.5 - 15.5 %   Platelets 201 150 - 400 K/uL   Neutrophils Relative % 61 43 - 77 %   Neutro Abs 6.2 1.7 - 7.7 K/uL   Lymphocytes Relative 26 12 - 46 %   Lymphs Abs 2.6 0.7 - 4.0 K/uL   Monocytes Relative 10 3 - 12 %   Monocytes Absolute 0.9 0.1 - 1.0 K/uL   Eosinophils Relative 2 0 - 5 %   Eosinophils Absolute 0.2 0.0 - 0.7 K/uL   Basophils Relative 1 0 - 1 %   Basophils Absolute 0.1 0.0 - 0.1 K/uL  Basic metabolic panel  Result Value Ref Range   Sodium 139 135 - 145 mmol/L   Potassium 5.3 (H) 3.5 - 5.1 mmol/L   Chloride 106 96 - 112 mEq/L   CO2 24 19 - 32 mmol/L   Glucose, Bld 95 70 - 99 mg/dL   BUN 14 6 - 23 mg/dL   Creatinine, Ser 1.16 0.50 - 1.35 mg/dL   Calcium 9.5 8.4 - 10.5 mg/dL   GFR calc non Af Amer 63 (L) >90 mL/min   GFR calc Af Amer 73 (L) >90 mL/min   Anion gap 9 5 - 15  D-dimer, quantitative  Result Value Ref Range   D-Dimer, Quant 3.85 (H) 0.00 - 0.48 ug/mL-FEU  I-stat troponin, ED  Result Value Ref Range   Troponin i, poc  0.00 0.00 - 0.08 ng/mL   Comment 3           Dg Chest 2 View  11/30/2014   CLINICAL DATA:  Shortness of breath, worsening over the last 2-3 days.  EXAM: CHEST  2 VIEW  COMPARISON:  11/28/2014  FINDINGS: The heart size and mediastinal contours are within normal limits. Both lungs are clear. The visualized skeletal structures are unremarkable.  IMPRESSION: No active cardiopulmonary disease.   Electronically Signed   By: Rolm Baptise M.D.   On: 11/30/2014 12:51    Patient seen by me. Patient with exertional shortness of breath following of 2 long plane flights in December and early part of January. Symptoms started on Saturday. D-dimer is markedly elevated. Clinically we were suspicious of possible PE. Patient was CT angiogram to confirm a most likely require admission. Not tachycardic oxygen saturation is 96-100%. Lungs are clear bilaterally.  Fredia Sorrow, MD 11/30/14 1420

## 2014-11-30 NOTE — ED Notes (Signed)
PA at bedside,  

## 2014-11-30 NOTE — ED Notes (Addendum)
Pt states that he has had 2 long plane flights 11/01/14 and 11/21/14 -- with minimal movement. Has had cramping in calves since. Shortness of breath started Saturday. Normally works out, has been unable to lately. Unable to walk up a flight of stairs without becoming short of breath.

## 2014-11-30 NOTE — ED Provider Notes (Signed)
CSN: [id number]     Arrival date & time 11/30/14  1137 History   First MD Initiated Contact with Patient 11/30/14 1148     Chief Complaint  Patient presents with  . Shortness of Breath     (Consider location/radiation/quality/duration/timing/severity/associated sxs/prior Treatment) HPI Comments: Patient is 69 year old male with no past medical history who presents with dyspnea on exertion for the past 5 days. Symptoms started gradually and progressively worsened since the onset. Patient reports working out and running regularly which he has not been able to do because his SOB. Patient reports becoming SOB walking around the house. Patient was seen at his PCP office 2 days ago where he had an unremarkable chest xray. His symptoms acutely worsened today so patient went back to his PCP who sent him to the ED due to oxygen saturation at 91%. Patient denies any chest pain or other associated symptoms. Patient is not a smoker. He does report family history of CAD. Patient reports flying home from [place] about 10 days ago. He denies any previous blood clot.    History reviewed. No pertinent past medical history. History reviewed. No pertinent past surgical history. History reviewed. No pertinent family history. History  Substance Use Topics  . Smoking status: Not on file  . Smokeless tobacco: Not on file  . Alcohol Use: No    Review of Systems  Constitutional: Negative for fever, chills and fatigue.  HENT: Negative for trouble swallowing.   Eyes: Negative for visual disturbance.  Respiratory: Positive for shortness of breath.   Cardiovascular: Negative for chest pain and palpitations.  Gastrointestinal: Negative for nausea, vomiting, abdominal pain and diarrhea.  Genitourinary: Negative for dysuria and difficulty urinating.  Musculoskeletal: Negative for arthralgias and neck pain.  Skin: Negative for color change.  Neurological: Negative for dizziness and weakness.   Psychiatric/Behavioral: Negative for dysphoric mood.      Allergies  Lamisil and Statins  Home Medications   Prior to Admission medications   Not on File   BP 155/81 mmHg  Pulse 80  Temp(Src) 97.4 F (36.3 C) (Oral)  Resp 18  SpO2 96% Physical Exam  Constitutional: He is oriented to person, place, and time. He appears well-developed and well-nourished. No distress.  HENT:  Head: Normocephalic and atraumatic.  Eyes: Conjunctivae and EOM are normal.  Neck: Normal range of motion.  Cardiovascular: Normal rate and regular rhythm.  Exam reveals no gallop and no friction rub.   No murmur heard. Pulmonary/Chest: Effort normal and breath sounds normal. He has no wheezes. He has no rales. He exhibits no tenderness.  Abdominal: Soft. He exhibits no distension. There is no tenderness. There is no rebound.  Musculoskeletal: Normal range of motion.  No calf tenderness or lower extremity edema noted.   Neurological: He is alert and oriented to person, place, and time. Coordination normal.  Speech is goal-oriented. Moves limbs without ataxia.   Skin: Skin is warm and dry.  Psychiatric: He has a normal mood and affect. His behavior is normal.  Nursing note and vitals reviewed.   ED Course  Procedures (including critical care time) Labs Review Labs Reviewed  BASIC METABOLIC PANEL - Abnormal; Notable for the following:    Potassium 5.3 (*)    GFR calc non Af Amer 63 (*)    GFR calc Af Amer 73 (*)    All other components within normal limits  BRAIN NATRIURETIC PEPTIDE - Abnormal; Notable for the following:    B Natriuretic Peptide 114.0 (*)  All other components within normal limits  D-DIMER, QUANTITATIVE - Abnormal; Notable for the following:    D-Dimer, Quant 3.85 (*)    All other components within normal limits  LUPUS ANTICOAGULANT PANEL - Abnormal; Notable for the following:    PTT Lupus Anticoagulant 47.3 (*)    PTTLA 4:1 Mix 48.5 (*)    All other components within  normal limits  CARDIOLIPIN ANTIBODIES, IGG, IGM, IGA - Abnormal; Notable for the following:    Anticardiolipin IgG 8 (*)    Anticardiolipin IgM 5 (*)    Anticardiolipin IgA 8 (*)    All other components within normal limits  ANA - Abnormal; Notable for the following:    ANA Ser Ql POSITIVE (*)    All other components within normal limits  HEPARIN LEVEL (UNFRACTIONATED) - Abnormal; Notable for the following:    Heparin Unfractionated 1.02 (*)    All other components within normal limits  BASIC METABOLIC PANEL - Abnormal; Notable for the following:    Glucose, Bld 123 (*)    GFR calc non Af Amer 57 (*)    GFR calc Af Amer 66 (*)    Anion gap 16 (*)    All other components within normal limits  ANTI-NUCLEAR AB-TITER (ANA TITER) - Abnormal; Notable for the following:    ANA Titer 1 1:80 (*)    All other components within normal limits  CBC WITH DIFFERENTIAL  SEDIMENTATION RATE  CBC  HOMOCYSTEINE  FACTOR 5 LEIDEN  PROTHROMBIN GENE MUTATION  HEPARIN LEVEL (UNFRACTIONATED)  I-STAT TROPOININ, ED    Imaging Review Dg Chest 2 View  11/30/2014   CLINICAL DATA:  Shortness of breath, worsening over the last 2-3 days.  EXAM: CHEST  2 VIEW  COMPARISON:  11/28/2014  FINDINGS: The heart size and mediastinal contours are within normal limits. Both lungs are clear. The visualized skeletal structures are unremarkable.  IMPRESSION: No active cardiopulmonary disease.   Electronically Signed   By: Rolm Baptise M.D.   On: 11/30/2014 12:51   Ct Angio Chest Pe W/cm &/or Wo Cm  11/30/2014   CLINICAL DATA:  Dyspnea on exertion.  Recent 7 hr flight.  EXAM: CT ANGIOGRAPHY CHEST WITH CONTRAST  TECHNIQUE: Multidetector CT imaging of the chest was performed using the standard protocol during bolus administration of intravenous contrast. Multiplanar CT image reconstructions and MIPs were obtained to evaluate the vascular anatomy.  CONTRAST:  167mL OMNIPAQUE IOHEXOL 350 MG/ML SOLN  COMPARISON:  Chest radiographs  11/30/2014  FINDINGS: Pulmonary emboli are present in the proximal right upper lobe artery, proximal right lower lobe artery, lingular branches, left lower lobe lobar, segmental, and subsegmental branches. Subsegmental right lower lobe branches are also involved. RV/ LV ratio is 0.95 and the intraventricular septum mildly does into the left ventricle. Heart is within normal limits for size. Three-vessel coronary artery calcification is noted.  No enlarged axillary, mediastinal, or hilar lymph nodes are identified. There is trace pericardial fluid/ thickening. There is no pleural effusion. Minimal dependent atelectasis is present in the lower lobes. Major airways are patent.  Visualized portion of the upper abdomen demonstrates prior cholecystectomy. Mild thoracic spondylosis is noted.  Review of the MIP images confirms the above findings.  IMPRESSION: 1. Positive for acute PE with CT evidence of right heartstrain (RV/LV Ratio = 0.95) consistent with at least submassive (intermediate risk) PE. The presence of right heart strain has been associated with an increased risk of morbidity and mortality. Consultation with Pulmonary and Critical Care Medicine  is recommended. 2. Three-vessel coronary artery calcification. Critical Value/emergent results were called by telephone at the time of interpretation on 11/30/2014 at 5:05 pm to Dr. Winfred Leeds, who verbally acknowledged these results.   Electronically Signed   By: Logan Bores   On: 11/30/2014 17:06     EKG Interpretation   Date/Time:  Wednesday November 30 2014 11:42:09 EST Ventricular Rate:  76 PR Interval:  218 QRS Duration: 102 QT Interval:  372 QTC Calculation: 418 R Axis:   97 Text Interpretation:  Sinus rhythm with 1st degree A-V block Possible Left  atrial enlargement Rightward axis Nonspecific T wave abnormality Abnormal  ECG Confirmed by ZACKOWSKI  MD, SCOTT ([id number]) on 11/30/2014 1:51:41 PM      MDM   Final diagnoses:  SOB (shortness of  breath)  Dyspnea on exertion  Pulmonary embolism    1:25 PM Chest xray unremarkable for acute changes. Labs and EKG pending. Vitals stable and patient afebrile.   Patient has an elevated d-dimer at 3.85. Patient will have CT angio. Patient signed out to [facility], [place], PA-C 12/01/14 2019

## 2014-11-30 NOTE — ED Notes (Signed)
Attempted report x1. 

## 2014-11-30 NOTE — ED Notes (Signed)
Pt temporarily moved to FT room for Venous Duplex US.

## 2014-12-01 ENCOUNTER — Encounter (HOSPITAL_COMMUNITY): Payer: Self-pay

## 2014-12-01 DIAGNOSIS — I369 Nonrheumatic tricuspid valve disorder, unspecified: Secondary | ICD-10-CM

## 2014-12-01 LAB — LUPUS ANTICOAGULANT PANEL
DRVVT: 33.8 secs (ref <42.9)
Lupus Anticoagulant: NOT DETECTED
PTT Lupus Anticoagulant: 47.3 secs — ABNORMAL HIGH (ref 28.0–43.0)
PTTLA 4:1 Mix: 48.5 secs — ABNORMAL HIGH (ref 28.0–43.0)
PTTLA Confirmation: 3.5 secs (ref <8.0)

## 2014-12-01 LAB — HEPARIN LEVEL (UNFRACTIONATED): Heparin Unfractionated: 1.02 IU/mL — ABNORMAL HIGH (ref 0.30–0.70)

## 2014-12-01 LAB — BASIC METABOLIC PANEL
ANION GAP: 16 — AB (ref 5–15)
BUN: 14 mg/dL (ref 6–23)
CO2: 24 mmol/L (ref 19–32)
CREATININE: 1.26 mg/dL (ref 0.50–1.35)
Calcium: 9.7 mg/dL (ref 8.4–10.5)
Chloride: 102 mEq/L (ref 96–112)
GFR calc Af Amer: 66 mL/min — ABNORMAL LOW (ref >90)
GFR calc non Af Amer: 57 mL/min — ABNORMAL LOW (ref >90)
Glucose, Bld: 123 mg/dL — ABNORMAL HIGH (ref 70–99)
Potassium: 3.9 mmol/L (ref 3.5–5.1)
SODIUM: 142 mmol/L (ref 135–145)

## 2014-12-01 LAB — ANA: Anti Nuclear Antibody(ANA): POSITIVE — AB

## 2014-12-01 LAB — CARDIOLIPIN ANTIBODIES, IGG, IGM, IGA
ANTICARDIOLIPIN IGG: 8 GPL U/mL — AB (ref <23)
ANTICARDIOLIPIN IGM: 5 [MPL'U]/mL — AB (ref <11)
Anticardiolipin IgA: 8 APL U/mL — ABNORMAL LOW (ref <22)

## 2014-12-01 LAB — ANTI-NUCLEAR AB-TITER (ANA TITER)

## 2014-12-01 MED ORDER — HEPARIN (PORCINE) IN NACL 100-0.45 UNIT/ML-% IJ SOLN
1250.0000 [IU]/h | INTRAMUSCULAR | Status: DC
  Filled 2014-12-01: qty 250

## 2014-12-01 MED ORDER — RIVAROXABAN (XARELTO) EDUCATION KIT FOR DVT/PE PATIENTS
PACK | Freq: Once | Status: AC
  Administered 2014-12-01: 17:00:00
  Filled 2014-12-01: qty 1

## 2014-12-01 MED ORDER — RIVAROXABAN 15 MG PO TABS
15.0000 mg | ORAL_TABLET | Freq: Two times a day (BID) | ORAL | Status: DC
  Administered 2014-12-01 – 2014-12-02 (×2): 15 mg via ORAL
  Filled 2014-12-01 (×4): qty 1

## 2014-12-01 NOTE — Progress Notes (Signed)
ANTICOAGULATION CONSULT NOTE - Initial Consult  Pharmacy Consult for Heparin  Indication: pulmonary embolus  Allergies  Allergen Reactions  . Statins     Muscle aches  . Lamisil [Terbinafine Hcl] Rash    Patient Measurements:  Vital Signs: Temp: 97.8 F (36.6 C) (01/13 1925) Temp Source: Oral (01/13 1925) BP: 131/84 mmHg (01/13 1925) Pulse Rate: 71 (01/13 1925)  Labs:  Recent Labs  11/30/14 11/30/14 0500 11/30/14 1151  HGB  --  14.5 15.6  HCT  --  41.1 45.1  PLT  --  187 201  HEPARINUNFRC 1.02*  --   --   CREATININE  --   --  1.16    Estimated Creatinine Clearance: 69.8 mL/min (by C-G formula based on Cr of 1.16).   Medical History: Past Medical History  Diagnosis Date  . Arthritis     Medications: None Assessment:  67 YOM presenting to MED on 11/30/14 c/o SOB on exertion x 1 week.  Patient reports two long plane flights on 11/01/14 & 11/21/14 in which calves began to cramp.  D-dimer elevated.  CT chest revealed positive for acute PE with evidence of right heart strain.   Pharmacy has been consulted to dose heparin in a setting of PE.  CBC wnl.  No bleeding noted.  HL #1 resulted at 1.02 iu/ml. Most likely partial bolus effect. Will decrease to 1250 units/hr and recheck 6 hour HL   Goal of Therapy:  Heparin level 0.3-0.7 units/ml Monitor platelets by anticoagulation protocol: Yes   Plan:   Decrease heparin to 1250  Recheck HL in 6 hours.   Curlene Dolphin

## 2014-12-01 NOTE — Progress Notes (Signed)
UR Completed.  [phone]  

## 2014-12-01 NOTE — Progress Notes (Signed)
  Echocardiogram 2D Echocardiogram has been performed.  Bobbye Charleston 12/01/2014, 9:21 AM

## 2014-12-01 NOTE — Progress Notes (Signed)
PATIENT DETAILS Name: George Blankenship Age: 69 y.o. Sex: male Date of Birth: 02-21-1946 Admit Date: 11/30/2014 Admitting Physician Louellen Molder, MD NAT:[id number],GURKY Marigene Ehlers, MD  Subjective: Much better-ambulated in hallway, requests overnight stay to make sure "things" are moving in the right direction.  Assessment/Plan: Principal Problem:   Acute pulmonary embolism:provoked by recent travel. Admitted and started on IV heparin, although some right heart strain seen on CT scan, clinically without any significant evidence of right heart strain. 2-D echocardiogram negative for right ventricular strain as well. Seen and evaluated by pulmonology, recommendations are to transition to oral anticoagulation, patient deemed not to be a candidate for catheter guided thrombolytic therapy. This M.D., had a long discussion with patient regarding various options of anticoagulation, Coumadin vs NOAC-discussed in great detail,pros/cons of different agent discussed.Patient prefers to go on Xarelto. We'll continue with overnight monitoring, if continues to improve-suspect an discharge on 12/02/14. Note patient ambulated in the hallway, and did 20 squats without any significant issues.  Active Problems:   Hyperkalemia:resolved  Disposition: Remain inpatient-home in am  Antibiotics: None   Anti-infectives    None      DVT Prophylaxis: Xarelto  Code Status: Full code   Family Communication Spouse at bedside  Procedures:  None  CONSULTS:  pulmonary/intensive care  Time spent 40 minutes-which includes 50% of the time with face-to-face with patient/ family and coordinating care related to the above assessment and plan.  MEDICATIONS: Scheduled Meds: . meloxicam  7.5 mg Oral Daily  . omega-3 acid ethyl esters  1 g Oral Daily  . rivaroxaban   Does not apply Once  . Rivaroxaban  15 mg Oral BID WC  . sodium chloride  3 mL Intravenous Q12H  . traZODone  50 mg Oral Once    Continuous Infusions: . heparin     PRN Meds:.acetaminophen **OR** acetaminophen    PHYSICAL EXAM: Vital signs in last 24 hours: Filed Vitals:   11/30/14 1730 11/30/14 1925 12/01/14 0500 12/01/14 1336  BP: 105/73 131/84 116/72 124/75  Pulse: 80 71 73 85  Temp:  97.8 F (36.6 C) 97.9 F (36.6 C) 97.5 F (36.4 C)  TempSrc:  Oral Oral Oral  Resp:   18 19  Height:      Weight:  82.691 kg (182 lb 4.8 oz)    SpO2: 99% 99% 98% 98%    Weight change:  Filed Weights   11/30/14 1717 11/30/14 1925  Weight: 83.915 kg (185 lb) 82.691 kg (182 lb 4.8 oz)   Body mass index is 24.06 kg/(m^2).   Gen Exam: Awake and alert with clear speech.   Neck: Supple, No JVD.   Chest: B/L Clear.   CVS: S1 S2 Regular, no murmurs.  Abdomen: soft, BS +, non tender, non distended.  Extremities: no edema, lower extremities warm to touch. Neurologic: Non Focal.   Skin: No Rash.   Wounds: N/A.   Intake/Output from previous day:  Intake/Output Summary (Last 24 hours) at 12/01/14 1438 Last data filed at 12/01/14 1336  Gross per 24 hour  Intake    600 ml  Output      0 ml  Net    600 ml     LAB RESULTS: CBC  Recent Labs Lab 11/30/14 0500 11/30/14 1151  WBC 9.3 9.9  HGB 14.5 15.6  HCT 41.1 45.1  PLT 187 201  MCV 86.0 87.1  MCH 30.3 30.1  MCHC 35.3 34.6  RDW 13.4 13.5  LYMPHSABS  --  2.6  MONOABS  --  0.9  EOSABS  --  0.2  BASOSABS  --  0.1    Chemistries   Recent Labs Lab 11/30/14 1151 12/01/14 0935  NA 139 142  K 5.3* 3.9  CL 106 102  CO2 24 24  GLUCOSE 95 123*  BUN 14 14  CREATININE 1.16 1.26  CALCIUM 9.5 9.7    CBG: No results for input(s): GLUCAP in the last 168 hours.  GFR Estimated Creatinine Clearance: 64.3 mL/min (by C-G formula based on Cr of 1.26).  Coagulation profile No results for input(s): INR, PROTIME in the last 168 hours.  Cardiac Enzymes No results for input(s): CKMB, TROPONINI, MYOGLOBIN in the last 168 hours.  Invalid input(s):  CK  Invalid input(s): POCBNP  Recent Labs  11/30/14 1326  DDIMER 3.85*   No results for input(s): HGBA1C in the last 72 hours. No results for input(s): CHOL, HDL, LDLCALC, TRIG, CHOLHDL, LDLDIRECT in the last 72 hours. No results for input(s): TSH, T4TOTAL, T3FREE, THYROIDAB in the last 72 hours.  Invalid input(s): FREET3 No results for input(s): VITAMINB12, FOLATE, FERRITIN, TIBC, IRON, RETICCTPCT in the last 72 hours. No results for input(s): LIPASE, AMYLASE in the last 72 hours.  Urine Studies No results for input(s): UHGB, CRYS in the last 72 hours.  Invalid input(s): UACOL, UAPR, USPG, UPH, UTP, UGL, UKET, UBIL, UNIT, UROB, ULEU, UEPI, UWBC, URBC, UBAC, CAST, UCOM, BILUA  MICROBIOLOGY: No results found for this or any previous visit (from the past 240 hour(s)).  RADIOLOGY STUDIES/RESULTS: Dg Chest 2 View  11/30/2014   CLINICAL DATA:  Shortness of breath, worsening over the last 2-3 days.  EXAM: CHEST  2 VIEW  COMPARISON:  11/28/2014  FINDINGS: The heart size and mediastinal contours are within normal limits. Both lungs are clear. The visualized skeletal structures are unremarkable.  IMPRESSION: No active cardiopulmonary disease.   Electronically Signed   By: Rolm Baptise M.D.   On: 11/30/2014 12:51   Ct Angio Chest Pe W/cm &/or Wo Cm  11/30/2014   CLINICAL DATA:  Dyspnea on exertion.  Recent 7 hr flight.  EXAM: CT ANGIOGRAPHY CHEST WITH CONTRAST  TECHNIQUE: Multidetector CT imaging of the chest was performed using the standard protocol during bolus administration of intravenous contrast. Multiplanar CT image reconstructions and MIPs were obtained to evaluate the vascular anatomy.  CONTRAST:  179mL OMNIPAQUE IOHEXOL 350 MG/ML SOLN  COMPARISON:  Chest radiographs 11/30/2014  FINDINGS: Pulmonary emboli are present in the proximal right upper lobe artery, proximal right lower lobe artery, lingular branches, left lower lobe lobar, segmental, and subsegmental branches. Subsegmental right  lower lobe branches are also involved. RV/ LV ratio is 0.95 and the intraventricular septum mildly does into the left ventricle. Heart is within normal limits for size. Three-vessel coronary artery calcification is noted.  No enlarged axillary, mediastinal, or hilar lymph nodes are identified. There is trace pericardial fluid/ thickening. There is no pleural effusion. Minimal dependent atelectasis is present in the lower lobes. Major airways are patent.  Visualized portion of the upper abdomen demonstrates prior cholecystectomy. Mild thoracic spondylosis is noted.  Review of the MIP images confirms the above findings.  IMPRESSION: 1. Positive for acute PE with CT evidence of right heartstrain (RV/LV Ratio = 0.95) consistent with at least submassive (intermediate risk) PE. The presence of right heart strain has been associated with an increased risk of morbidity and mortality. Consultation with Pulmonary and Critical Care Medicine is recommended. 2. Three-vessel coronary artery calcification. Critical  Value/emergent results were called by telephone at the time of interpretation on 11/30/2014 at 5:05 pm to Dr. Winfred Leeds, who verbally acknowledged these results.   Electronically Signed   By: Logan Bores   On: 11/30/2014 17:06    Oren Binet, MD  [facility] Pager:[phone]  If 7PM-7AM, please contact night-coverage [email or web address] Password [email or web address] 12/01/2014, 2:38 PM   LOS: 1 day

## 2014-12-01 NOTE — Discharge Instructions (Signed)
Information on my medicine - XARELTO (rivaroxaban)  This medication education was reviewed with me or my healthcare representative as part of my discharge preparation.  The pharmacist that spoke with me during my hospital stay was:  Georgina Peer, [place] FOR YOU? Xarelto was prescribed to treat blood clots that may have been found in the veins of your legs (deep vein thrombosis) or in your lungs (pulmonary embolism) and to reduce the risk of them occurring again.  What do you need to know about Xarelto? The starting dose is one 15 mg tablet taken TWICE daily with food for the FIRST 21 DAYS then on (enter date)  2/4  the dose is changed to one 20 mg tablet taken ONCE A DAY with your evening meal.  DO NOT stop taking Xarelto without talking to the health care provider who prescribed the medication.  Refill your prescription for 20 mg tablets before you run out.  After discharge, you should have regular check-up appointments with your healthcare provider that is prescribing your Xarelto.  In the future your dose may need to be changed if your kidney function changes by a significant amount.  What do you do if you miss a dose? If you are taking Xarelto TWICE DAILY and you miss a dose, take it as soon as you remember. You may take two 15 mg tablets (total 30 mg) at the same time then resume your regularly scheduled 15 mg twice daily the next day.  If you are taking Xarelto ONCE DAILY and you miss a dose, take it as soon as you remember on the same day then continue your regularly scheduled once daily regimen the next day. Do not take two doses of Xarelto at the same time.   Important Safety Information Xarelto is a blood thinner medicine that can cause bleeding. You should call your healthcare provider right away if you experience any of the following: ? Bleeding from an injury or your nose that does not stop. ? Unusual colored urine (red or dark brown) or  unusual colored stools (red or black). ? Unusual bruising for unknown reasons. ? A serious fall or if you hit your head (even if there is no bleeding).  Some medicines may interact with Xarelto and might increase your risk of bleeding while on Xarelto. To help avoid this, consult your healthcare provider or pharmacist prior to using any new prescription or non-prescription medications, including herbals, vitamins, non-steroidal anti-inflammatory drugs (NSAIDs) and supplements.  This website has more information on Xarelto: [email or web address]

## 2014-12-01 NOTE — Progress Notes (Addendum)
[place] for Heparin>>xarelto Indication: pulmonary embolus  Allergies  Allergen Reactions  . Statins     Muscle aches  . Lamisil [Terbinafine Hcl] Rash    Patient Measurements:  Vital Signs: Temp: 97.9 F (36.6 C) (01/14 0500) Temp Source: Oral (01/14 0500) BP: 116/72 mmHg (01/14 0500) Pulse Rate: 73 (01/14 0500)  Labs:  Recent Labs  11/30/14 11/30/14 0500 11/30/14 1151 12/01/14 0935  HGB  --  14.5 15.6  --   HCT  --  41.1 45.1  --   PLT  --  187 201  --   HEPARINUNFRC 1.02*  --   --   --   CREATININE  --   --  1.16 1.26    Estimated Creatinine Clearance: 64.3 mL/min (by C-G formula based on Cr of 1.26).   Medical History: Past Medical History  Diagnosis Date  . Arthritis     Medications: None Assessment:  8 YOM presenting to MED on 11/30/14 c/o SOB on exertion x 1 week.  Patient reports two long plane flights on 11/01/14 & 11/21/14 in which calves began to cramp.  D-dimer elevated.  CT chest revealed positive for acute PE with evidence of right heart strain.    New orders to transition to xarelto this evening for PE. Will stop heparin when xarelto given. No bleeding noted. Not a candidate for thrombolytics as he appears hemodynamically stable.  Goal of Therapy:  Heparin level 0.3-0.7 units/ml Monitor platelets by anticoagulation protocol: Yes   Plan:  Continue heparin at 1250 until 1630 then d/c Xarelto 15mg  bid x21 days then 20mg  daily Follow up cbc in am if still here  Erin Hearing PharmD., BCPS Clinical Pharmacist Pager [phone] 12/01/2014 1:28 PM  Education completed 12/01/2014  Discussed with patient and wife side effects, monitoring, and dosing of medication. All questions were answered.

## 2014-12-02 LAB — HOMOCYSTEINE: HOMOCYSTEINE-NORM: 11 umol/L (ref 0.0–15.0)

## 2014-12-02 MED ORDER — RIVAROXABAN 15 MG PO TABS: 15.0000 mg | ORAL_TABLET | Freq: Two times a day (BID) | ORAL | Status: DC

## 2014-12-02 MED ORDER — RIVAROXABAN 20 MG PO TABS
20.0000 mg | ORAL_TABLET | Freq: Every day | ORAL | Status: DC
Start: 2014-12-22 — End: 2016-06-04

## 2014-12-02 NOTE — Discharge Summary (Signed)
PATIENT DETAILS Name: George Blankenship Age: 69 y.o. Sex: male Date of Birth: 1945-12-13 MRN: [id number]. Admitting Physician: No admitting provider for patient encounter. OTL:XBWIOM,BTDHR George Ehlers, MD  Admit Date: 11/30/2014 Discharge date: 12/02/2014  Recommendations for Outpatient Follow-up:  1. Please consider referral to hematology prior to stopping anticoagulation 2. Mildly elevated ANA at 1:80 titer, please do further workup if needed as outpatient.  PRIMARY DISCHARGE DIAGNOSIS:  Principal Problem:   Acute pulmonary embolism Active Problems:   Hyperkalemia      PAST MEDICAL HISTORY: Past Medical History  Diagnosis Date  . Arthritis     DISCHARGE MEDICATIONS: Current Discharge Medication List    START taking these medications   Details  !! Rivaroxaban (XARELTO) 15 MG TABS tablet Take 1 tablet (15 mg total) by mouth 2 (two) times daily with a meal. Last dose on 12/21/14 pm Qty: 40 tablet, Refills: 0    !! rivaroxaban (XARELTO) 20 MG TABS tablet Take 1 tablet (20 mg total) by mouth daily with supper. Start on 12/22/13 Qty: 30 tablet, Refills: 2     !! - Potential duplicate medications found. Please discuss with provider.    CONTINUE these medications which have NOT CHANGED   Details  meloxicam (MOBIC) 7.5 MG tablet Take 7.5 mg by mouth daily.    Omega-3 Fatty Acids (FISH OIL) 1000 MG CAPS Take 1,000 mg by mouth daily.        ALLERGIES:   Allergies  Allergen Reactions  . Statins     Muscle aches  . Lamisil [Terbinafine Hcl] Rash    BRIEF HPI:  See H&P, Labs, Consult and Test reports for all details in brief, patient was admitted for evaluation of exertional dyspnea, further workup revealed pulmonary embolism.  CONSULTATIONS:   pulmonary/intensive care  PERTINENT RADIOLOGIC STUDIES: Dg Chest 2 View  11/30/2014   CLINICAL DATA:  Shortness of breath, worsening over the last 2-3 days.  EXAM: CHEST  2 VIEW  COMPARISON:  11/28/2014  FINDINGS: The heart size  and mediastinal contours are within normal limits. Both lungs are clear. The visualized skeletal structures are unremarkable.  IMPRESSION: No active cardiopulmonary disease.   Electronically Signed   By: George Blankenship M.D.   On: 11/30/2014 12:51   Ct Angio Chest Pe W/cm &/or Wo Cm  11/30/2014   CLINICAL DATA:  Dyspnea on exertion.  Recent 7 hr flight.  EXAM: CT ANGIOGRAPHY CHEST WITH CONTRAST  TECHNIQUE: Multidetector CT imaging of the chest was performed using the standard protocol during bolus administration of intravenous contrast. Multiplanar CT image reconstructions and MIPs were obtained to evaluate the vascular anatomy.  CONTRAST:  17mL OMNIPAQUE IOHEXOL 350 MG/ML SOLN  COMPARISON:  Chest radiographs 11/30/2014  FINDINGS: Pulmonary emboli are present in the proximal right upper lobe artery, proximal right lower lobe artery, lingular branches, left lower lobe lobar, segmental, and subsegmental branches. Subsegmental right lower lobe branches are also involved. RV/ LV ratio is 0.95 and the intraventricular septum mildly does into the left ventricle. Heart is within normal limits for size. Three-vessel coronary artery calcification is noted.  No enlarged axillary, mediastinal, or hilar lymph nodes are identified. There is trace pericardial fluid/ thickening. There is no pleural effusion. Minimal dependent atelectasis is present in the lower lobes. Major airways are patent.  Visualized portion of the upper abdomen demonstrates prior cholecystectomy. Mild thoracic spondylosis is noted.  Review of the MIP images confirms the above findings.  IMPRESSION: 1. Positive for acute PE with CT evidence of right  heartstrain (RV/LV Ratio = 0.95) consistent with at least submassive (intermediate risk) PE. The presence of right heart strain has been associated with an increased risk of morbidity and mortality. Consultation with Pulmonary and Critical Care Medicine is recommended. 2. Three-vessel coronary artery  calcification. Critical Value/emergent results were called by telephone at the time of interpretation on 11/30/2014 at 5:05 pm to Dr. Winfred Blankenship, who verbally acknowledged these results.   Electronically Signed   By: George Blankenship   On: 11/30/2014 17:06     PERTINENT LAB RESULTS: CBC:  Recent Labs  11/30/14 0500 11/30/14 1151  WBC 9.3 9.9  HGB 14.5 15.6  HCT 41.1 45.1  PLT 187 201   CMET CMP     Component Value Date/Time   NA 142 12/01/2014 0935   K 3.9 12/01/2014 0935   CL 102 12/01/2014 0935   CO2 24 12/01/2014 0935   GLUCOSE 123* 12/01/2014 0935   BUN 14 12/01/2014 0935   CREATININE 1.26 12/01/2014 0935   CALCIUM 9.7 12/01/2014 0935   GFRNONAA 57* 12/01/2014 0935   GFRAA 66* 12/01/2014 0935    GFR Estimated Creatinine Clearance: 64.3 mL/min (by C-G formula based on Cr of 1.26). No results for input(s): LIPASE, AMYLASE in the last 72 hours. No results for input(s): CKTOTAL, CKMB, CKMBINDEX, TROPONINI in the last 72 hours. Invalid input(s): POCBNP  Recent Labs  11/30/14 1326  DDIMER 3.85*   No results for input(s): HGBA1C in the last 72 hours. No results for input(s): CHOL, HDL, LDLCALC, TRIG, CHOLHDL, LDLDIRECT in the last 72 hours. No results for input(s): TSH, T4TOTAL, T3FREE, THYROIDAB in the last 72 hours.  Invalid input(s): FREET3 No results for input(s): VITAMINB12, FOLATE, FERRITIN, TIBC, IRON, RETICCTPCT in the last 72 hours. Coags: No results for input(s): INR in the last 72 hours.  Invalid input(s): PT Microbiology: No results found for this or any previous visit (from the past 240 hour(s)).   BRIEF HOSPITAL COURSE:  Acute pulmonary embolism:provoked by recent travel. Admitted and started on IV heparin, although some right heart strain seen on CT scan, clinically without any significant evidence of right heart strain. 2-D echocardiogram negative for right ventricular strain as well. Seen and evaluated by pulmonology, recommendations are to  transition to oral anticoagulation, patient deemed not to be a candidate for catheter guided thrombolytic therapy. This M.D., had a long discussion with patient regarding various options of anticoagulation, Coumadin vs NOAC-discussed in great detail,pros/cons of different agent discussed.Patient prefers  Xarelto. Doing well, ambulating in the hallway without any shortness of breath. I will defer to patient's primary care practitioner,to refer the patient to hematology prior to discontinuing anticoagulation.  Hyperkalemia:resolved  TODAY-DAY OF DISCHARGE:  Subjective:   George Blankenship today has no headache,no chest abdominal pain,no new weakness tingling or numbness, feels much better wants to go home today.   Objective:   Blood pressure 117/56, pulse 77, temperature 97.9 F (36.6 C), temperature source Oral, resp. rate 18, height 6\' 1"  (1.854 m), weight 82.691 kg (182 lb 4.8 oz), SpO2 96 %.  Intake/Output Summary (Last 24 hours) at 12/02/14 1146 Last data filed at 12/02/14 0830  Gross per 24 hour  Intake 1068.75 ml  Output      0 ml  Net 1068.75 ml   Filed Weights   11/30/14 1717 11/30/14 1925  Weight: 83.915 kg (185 lb) 82.691 kg (182 lb 4.8 oz)    Exam Awake Alert, Oriented *3, No new F.N deficits, Normal affect .AT,PERRAL Supple Neck,No JVD, No  cervical lymphadenopathy appriciated.  Symmetrical Chest wall movement, Good air movement bilaterally, CTAB RRR,No Gallops,Rubs or new Murmurs, No Parasternal Heave +ve B.Sounds, Abd Soft, Non tender, No organomegaly appriciated, No rebound -guarding or rigidity. No Cyanosis, Clubbing or edema, No new Rash or bruise  DISCHARGE CONDITION: Stable  DISPOSITION: Home  DISCHARGE INSTRUCTIONS:    Activity:  As tolerated   Diet recommendation: Regular Diet  Discharge Instructions    Call MD for:  difficulty breathing, headache or visual disturbances    Complete by:  As directed      Call MD for:  severe uncontrolled pain     Complete by:  As directed      Diet general    Complete by:  As directed      Increase activity slowly    Complete by:  As directed            Follow-up Information    Follow up with George Pac, MD. Schedule an appointment as soon as possible for a visit in 1 week.   Specialty:  Family Medicine   Contact information:   [place] [id number] [phone]       Follow up with [facility].   Why:  call [phone] a appt with a MD of your choice     Total Time spent on discharge equals 45 minutes.  SignedOren Blankenship 12/02/2014 11:46 AM

## 2014-12-02 NOTE — Progress Notes (Signed)
Pt/family given discharge instructions, medication lists, follow up appointments, and when to call the doctor.  Pt/family verbalizes understanding. Xarelto information and scripts given. Payton Emerald, RN

## 2014-12-02 NOTE — Care Management Note (Signed)
    Page 1 of 1   12/02/2014     3:14:39 PM CARE MANAGEMENT NOTE 12/02/2014  Patient:  George Blankenship, George Blankenship   Account Number:  [id number]  Date Initiated:  12/02/2014  Documentation initiated by:  Marvetta Gibbons  Subjective/Objective Assessment:   Pt admitted with PE     Action/Plan:   PTA pt lived at home with spouse   Anticipated DC Date:  12/02/2014   Anticipated DC Plan:  [place]  CM consult  Medication Assistance      Choice offered to / List presented to:             Status of service:  Completed, signed off Medicare Important Message given?  NA - LOS <3 / Initial given by admissions (If response is "NO", the following Medicare IM given date fields will be blank) Date Medicare IM given:   Medicare IM given by:   Date Additional Medicare IM given:   Additional Medicare IM given by:    Discharge Disposition:  HOME/SELF CARE  Per UR Regulation:  Reviewed for med. necessity/level of care/duration of stay  If discussed at [place] of Stay Meetings, dates discussed:    Comments:  12/02/14- 1100- Marvetta Gibbons RN ,BSN [phone] Referral for benefits check Eliquis vs Xarelto- 12/02/2014 [facility] department at [phone]. Talked with CSR MaKayla.  [name] is covered at 2.$RemoveBefo'5mg'eKsbhdJsiHp$  and $Re'5mg'kCt$  ONLY. $Remo'10mg'vhYDe$  not covered per [facility]. Co-Pay is $47.00 at a retail pharmacy.  [name] is covered at both $Remov'15mg'XCpahm$  and $Re'20mg'jIe$ . Co-Pay is $47.00 at a retail pharmacy.  Neither require a prior authorization.----- per MD pt will go home on Xarelto  spoke with pt at bedside- pt has d/c Xarelto kit with free 30 day card- explained copay $47 after use of card- pt uses [employer]. No further needs.

## 2014-12-03 LAB — FACTOR 5 LEIDEN

## 2014-12-03 LAB — PROTHROMBIN GENE MUTATION

## 2014-12-26 ENCOUNTER — Telehealth: Payer: Self-pay | Admitting: Hematology & Oncology

## 2014-12-26 NOTE — Telephone Encounter (Signed)
Left vm w NEW PATIENT today to remind them of their appointment with Dr. Ennever. Also, advised them to bring all medication bottles and insurance card information. ° °

## 2014-12-27 ENCOUNTER — Other Ambulatory Visit (HOSPITAL_BASED_OUTPATIENT_CLINIC_OR_DEPARTMENT_OTHER): Payer: Commercial Managed Care - HMO | Admitting: Lab

## 2014-12-27 ENCOUNTER — Ambulatory Visit: Payer: Commercial Managed Care - HMO

## 2014-12-27 ENCOUNTER — Telehealth: Payer: Self-pay | Admitting: Hematology & Oncology

## 2014-12-27 ENCOUNTER — Ambulatory Visit (HOSPITAL_BASED_OUTPATIENT_CLINIC_OR_DEPARTMENT_OTHER): Payer: Commercial Managed Care - HMO | Admitting: Family

## 2014-12-27 ENCOUNTER — Encounter: Payer: Self-pay | Admitting: Family

## 2014-12-27 VITALS — BP 116/76 | HR 73 | Temp 97.4°F | Resp 18 | Ht 72.0 in | Wt 187.0 lb

## 2014-12-27 DIAGNOSIS — I82432 Acute embolism and thrombosis of left popliteal vein: Secondary | ICD-10-CM

## 2014-12-27 DIAGNOSIS — I82442 Acute embolism and thrombosis of left tibial vein: Secondary | ICD-10-CM

## 2014-12-27 DIAGNOSIS — I82402 Acute embolism and thrombosis of unspecified deep veins of left lower extremity: Secondary | ICD-10-CM

## 2014-12-27 DIAGNOSIS — I2699 Other pulmonary embolism without acute cor pulmonale: Secondary | ICD-10-CM

## 2014-12-27 LAB — CBC WITH DIFFERENTIAL (CANCER CENTER ONLY)
BASO#: 0 10*3/uL (ref 0.0–0.2)
BASO%: 0.5 % (ref 0.0–2.0)
EOS%: 2.2 % (ref 0.0–7.0)
Eosinophils Absolute: 0.2 10*3/uL (ref 0.0–0.5)
HCT: 45.1 % (ref 38.7–49.9)
HGB: 15 g/dL (ref 13.0–17.1)
LYMPH#: 2.1 10*3/uL (ref 0.9–3.3)
LYMPH%: 26.9 % (ref 14.0–48.0)
MCH: 29.6 pg (ref 28.0–33.4)
MCHC: 33.3 g/dL (ref 32.0–35.9)
MCV: 89 fL (ref 82–98)
MONO#: 0.7 10*3/uL (ref 0.1–0.9)
MONO%: 8.9 % (ref 0.0–13.0)
NEUT#: 4.8 10*3/uL (ref 1.5–6.5)
NEUT%: 61.5 % (ref 40.0–80.0)
Platelets: 192 10*3/uL (ref 145–400)
RBC: 5.07 10*6/uL (ref 4.20–5.70)
RDW: 13.6 % (ref 11.1–15.7)
WBC: 7.7 10*3/uL (ref 4.0–10.0)

## 2014-12-27 NOTE — Progress Notes (Signed)
Hematology/Oncology Consultation   Name: JB DULWORTH      MRN: [id number]    Location: Room/bed info not found  Date: 12/27/2014 Time:11:26 AM   REFERRING PHYSICIAN: Lennette Bihari Little  REASON FOR CONSULT: Multiple PE's and DVT in left leg on Xarelto, elevated ANA     DIAGNOSIS:  1. Pulmonary embolisms 2. DVT of left leg  HISTORY OF PRESENT ILLNESS: Mr. Holzhauer is a very pleasant 69 yo white male with multiple pulmonary embolisms and a DVT in his left leg.  He travelled to [place] in January to visit family. On his way there he wore compression stockings and was able to get up and walk around on the plane. On the way back he forgot to were his stockings and he was also seated next to someone disabled and did not try to get up and stretch his legs. He also didn't have much to drink that day.  After retuning home he developed SOB and went to the ED.  He had a positive ANA titer.  [place] sound revealed DVT of left popliteal vein and posterior tibial vein.  He has been on Xarelto since January 14th.  Since he has started his Xarelto he no longer has SOB and has no experienced an pain or swelling of the left leg.  He denies any personal or familial history of bleeding or clotting disorders and no cancer.  He is very active and before being diagnosed was working out every day. He does not smoke or drink alcohol.  He is retired and has had no other lifestyle changes that might provoke a blood clot.  He denies fatigue, fever, chills, n/v, cough, rash, dizziness, SOB, chest pain, palpitations, abdominal pain, constipation, diarrhea, blood in urine or stool. No other bleeding.  No swelling, tenderness, numbness or tingling in his extremities. No new aches or pains.  His appetite is good and he is drinking plenty of fluids now. His weight is stable at 187.   ROS: All other 10 point review of systems is negative.   PAST MEDICAL HISTORY:   Past Medical History  Diagnosis Date  . Arthritis      ALLERGIES: Allergies  Allergen Reactions  . Statins     Muscle aches  . Lamisil [Terbinafine Hcl] Rash      MEDICATIONS:  Current Outpatient Prescriptions on File Prior to Visit  Medication Sig Dispense Refill  . meloxicam (MOBIC) 7.5 MG tablet Take 7.5 mg by mouth daily.    . Omega-3 Fatty Acids (FISH OIL) 1000 MG CAPS Take 1,000 mg by mouth daily.    . Rivaroxaban (XARELTO) 15 MG TABS tablet Take 1 tablet (15 mg total) by mouth 2 (two) times daily with a meal. Last dose on 12/21/14 pm 40 tablet 0  . rivaroxaban (XARELTO) 20 MG TABS tablet Take 1 tablet (20 mg total) by mouth daily with supper. Start on 12/22/13 30 tablet 2   No current facility-administered medications on file prior to visit.     PAST SURGICAL HISTORY No past surgical history on file.  FAMILY HISTORY: No family history on file.  SOCIAL HISTORY:  reports that he has never smoked. He does not have any smokeless tobacco history on file. He reports that he drinks alcohol. He reports that he does not use illicit drugs.  PERFORMANCE STATUS: The patient's performance status is 0 - Asymptomatic  PHYSICAL EXAM: Most Recent Vital Signs: There were no vitals taken for this visit. BP 116/76 mmHg  Pulse 73  Temp(Src)  97.4 F (36.3 C) (Oral)  Resp 18  Ht 6' (1.829 m)  Wt 187 lb (84.823 kg)  BMI 25.36 kg/m2  General Appearance:    Alert, cooperative, no distress, appears stated age  Head:    Normocephalic, without obvious abnormality, atraumatic  Eyes:    PERRL, conjunctiva/corneas clear, EOM's intact, fundi    benign, both eyes        Throat:   Lips, mucosa, and tongue normal; teeth and gums normal  Neck:   Supple, symmetrical, trachea midline, no adenopathy;    thyroid:  no enlargement/tenderness/nodules; no carotid   bruit or JVD  Back:     Symmetric, no curvature, ROM normal, no CVA tenderness  Lungs:     Clear to auscultation bilaterally, respirations unlabored  Chest Wall:    No tenderness or  deformity   Heart:    Regular rate and rhythm, S1 and S2 normal, no murmur, rub   or gallop  Breast Exam:    No tenderness, masses, or nipple abnormality  Abdomen:     Soft, non-tender, bowel sounds active all four quadrants,    no masses, no organomegaly        Extremities:   Extremities normal, atraumatic, no cyanosis or edema  Pulses:   2+ and symmetric all extremities  Skin:   Skin color, texture, turgor normal, no rashes or lesions  Lymph nodes:   Cervical, supraclavicular, and axillary nodes normal  Neurologic:   CNII-XII intact, normal strength, sensation and reflexes    throughout   LABORATORY DATA:  Results for orders placed or performed in visit on 12/27/14 (from the past 48 hour(s))  CBC with Differential [facility])     Status: None   Collection Time: 12/27/14 10:24 AM  Result Value Ref Range   WBC 7.7 4.0 - 10.0 10e3/uL   RBC 5.07 4.20 - 5.70 10e6/uL   HGB 15.0 13.0 - 17.1 g/dL   HCT 45.1 38.7 - 49.9 %   MCV 89 82 - 98 fL   MCH 29.6 28.0 - 33.4 pg   MCHC 33.3 32.0 - 35.9 g/dL   RDW 13.6 11.1 - 15.7 %   Platelets 192 145 - 400 10e3/uL   NEUT# 4.8 1.5 - 6.5 10e3/uL   LYMPH# 2.1 0.9 - 3.3 10e3/uL   MONO# 0.7 0.1 - 0.9 10e3/uL   Eosinophils Absolute 0.2 0.0 - 0.5 10e3/uL   BASO# 0.0 0.0 - 0.2 10e3/uL   NEUT% 61.5 40.0 - 80.0 %   LYMPH% 26.9 14.0 - 48.0 %   MONO% 8.9 0.0 - 13.0 %   EOS% 2.2 0.0 - 7.0 %   BASO% 0.5 0.0 - 2.0 %      RADIOGRAPHY: No results found.     PATHOLOGY: None  ASSESSMENT/PLAN: Mr. Royster is a very pleasant 69 yo white male with multiple pulmonary embolisms and a DVT in his left leg occuring after a long flight back from the [facility].  He started Xarelto on January 14th and is doing well. No bleeding. With both the PE and DVT it is appropriate that he remain on Xarelto for 2 years.  He is asymptomatic at this time.  We will plan on seeing him back here in 3 months for labs and also CT angio of chest and [place] of left leg the same  day.   All questions were answered and he is in agreement with this plan.  He knows to call here with any questions or concerns and to  go to the ED in the event of an emergency. We can certainly see him sooner if need be.   The patient was discussed with and also seen by Dr. Marin Olp and he is in agreement with the aforementioned.   [facility] M    Addendum:  I saw and examined the patient with Camila Norville. I agree with the above. He has symptomatic pulmonary emboli. He had bilateral emboli.  I believe that he clearly needs 2 years of anticoagulation. I feel that pulmonary emboli need to be treated more aggressively than just regular lower extremity thrombo-embolic disease.  He is in good shape. He is active physically. He has good renal function. I don't see any problem with him being on Xarelto for 2 years.  I would repeat a CT angiogram in 3 months and a Doppler of his leg in 3 months.  We'll plan to see him back the same day that he has his scans and Doppler.  I'm not sure what to make of the elevated ANA. I probably would have this repeated in about 6 months.  I probably would continue him on the Xarelto until January 2018.  We answered all his questions. We spent about 45 minutes with him.   Laurey Arrow

## 2014-12-27 NOTE — Telephone Encounter (Signed)
George Blankenship: [id number] Requesting Provider: Dr. Hulan Fess Treating Provider: Dr. Burney Gauze Visits: 6 Status: Approved Dates: 12/27/2014 - 06/25/2015     COPY SCANNED

## 2014-12-28 ENCOUNTER — Telehealth: Payer: Self-pay | Admitting: Hematology & Oncology

## 2014-12-28 NOTE — Telephone Encounter (Signed)
Left pt message to call for may appointments, I mailed schedule to pt also.

## 2014-12-29 LAB — PROTEIN C, TOTAL: Protein C, Total: 99 % (ref 72–160)

## 2014-12-29 LAB — PROTEIN S, TOTAL: Protein S Total: 82 % (ref 60–150)

## 2014-12-29 LAB — PROTEIN C ACTIVITY: Protein C Activity: 199 % — ABNORMAL HIGH (ref 75–133)

## 2014-12-29 LAB — D-DIMER, QUANTITATIVE (NOT AT ARMC): D DIMER QUANT: 0.27 ug{FEU}/mL (ref 0.00–0.48)

## 2014-12-29 LAB — ANTITHROMBIN III: AntiThromb III Func: 87 % (ref 76–126)

## 2014-12-29 LAB — PROTEIN S ACTIVITY: Protein S Activity: 113 % (ref 69–129)

## 2015-01-02 ENCOUNTER — Telehealth: Payer: Self-pay | Admitting: *Deleted

## 2015-01-02 NOTE — Telephone Encounter (Addendum)
Spoke with patient. He's happy with results.   ----- Message from Volanda Napoleon, MD sent at 12/29/2014  5:13 PM EST ----- Call - clotting studies are normal!!!  Laurey Arrow

## 2015-03-28 ENCOUNTER — Ambulatory Visit (HOSPITAL_BASED_OUTPATIENT_CLINIC_OR_DEPARTMENT_OTHER)
Admission: RE | Admit: 2015-03-28 | Discharge: 2015-03-28 | Disposition: A | Discharge status: Home / Self Care | Payer: Commercial Managed Care - HMO | Source: Ambulatory Visit | Attending: Family | Admitting: Family

## 2015-03-28 ENCOUNTER — Ambulatory Visit (HOSPITAL_BASED_OUTPATIENT_CLINIC_OR_DEPARTMENT_OTHER): Payer: Commercial Managed Care - HMO | Admitting: Hematology & Oncology

## 2015-03-28 ENCOUNTER — Encounter: Payer: Self-pay | Admitting: Hematology & Oncology

## 2015-03-28 ENCOUNTER — Ambulatory Visit (HOSPITAL_BASED_OUTPATIENT_CLINIC_OR_DEPARTMENT_OTHER): Payer: Commercial Managed Care - HMO | Admitting: Nurse Practitioner

## 2015-03-28 ENCOUNTER — Encounter (HOSPITAL_BASED_OUTPATIENT_CLINIC_OR_DEPARTMENT_OTHER): Payer: Self-pay

## 2015-03-28 VITALS — BP 121/72 | HR 75 | Temp 97.6°F | Resp 18 | Ht 72.0 in | Wt 189.0 lb

## 2015-03-28 DIAGNOSIS — I2510 Atherosclerotic heart disease of native coronary artery without angina pectoris: Secondary | ICD-10-CM | POA: Diagnosis not present

## 2015-03-28 DIAGNOSIS — I2699 Other pulmonary embolism without acute cor pulmonale: Secondary | ICD-10-CM

## 2015-03-28 DIAGNOSIS — I82402 Acute embolism and thrombosis of unspecified deep veins of left lower extremity: Secondary | ICD-10-CM | POA: Diagnosis not present

## 2015-03-28 DIAGNOSIS — K449 Diaphragmatic hernia without obstruction or gangrene: Secondary | ICD-10-CM | POA: Diagnosis not present

## 2015-03-28 DIAGNOSIS — Z86718 Personal history of other venous thrombosis and embolism: Secondary | ICD-10-CM | POA: Diagnosis not present

## 2015-03-28 DIAGNOSIS — R911 Solitary pulmonary nodule: Secondary | ICD-10-CM | POA: Insufficient documentation

## 2015-03-28 LAB — CBC WITH DIFFERENTIAL (CANCER CENTER ONLY)
BASO#: 0 10*3/uL (ref 0.0–0.2)
BASO%: 0.4 % (ref 0.0–2.0)
EOS%: 2.7 % (ref 0.0–7.0)
Eosinophils Absolute: 0.2 10*3/uL (ref 0.0–0.5)
HCT: 44 % (ref 38.7–49.9)
HGB: 15 g/dL (ref 13.0–17.1)
LYMPH#: 2.2 10*3/uL (ref 0.9–3.3)
LYMPH%: 31 % (ref 14.0–48.0)
MCH: 29.8 pg (ref 28.0–33.4)
MCHC: 34.1 g/dL (ref 32.0–35.9)
MCV: 87 fL (ref 82–98)
MONO#: 0.7 10*3/uL (ref 0.1–0.9)
MONO%: 10.2 % (ref 0.0–13.0)
NEUT#: 3.9 10*3/uL (ref 1.5–6.5)
NEUT%: 55.7 % (ref 40.0–80.0)
Platelets: 200 10*3/uL (ref 145–400)
RBC: 5.04 10*6/uL (ref 4.20–5.70)
RDW: 13.9 % (ref 11.1–15.7)
WBC: 7 10*3/uL (ref 4.0–10.0)

## 2015-03-28 LAB — BASIC METABOLIC PANEL - CANCER CENTER ONLY
BUN: 16 mg/dL (ref 7–22)
CALCIUM: 9.4 mg/dL (ref 8.0–10.3)
CO2: 29 meq/L (ref 18–33)
CREATININE: 1.1 mg/dL (ref 0.6–1.2)
Chloride: 105 mEq/L (ref 98–108)
GLUCOSE: 97 mg/dL (ref 73–118)
Potassium: 4.1 mEq/L (ref 3.3–4.7)
Sodium: 143 mEq/L (ref 128–145)

## 2015-03-28 LAB — D-DIMER, QUANTITATIVE: D-Dimer, Quant: 0.27 ug/mL-FEU (ref 0.00–0.48)

## 2015-03-28 MED ORDER — IOHEXOL 350 MG/ML SOLN
100.0000 mL | Freq: Once | INTRAVENOUS | Status: AC | PRN
  Administered 2015-03-28: 100 mL via INTRAVENOUS

## 2015-03-28 NOTE — Progress Notes (Signed)
Hematology and Oncology Follow Up Visit  George Blankenship [id number] 1946-09-21 69 y.o. 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 First saw him back in February. We did a hypercoagulable workup on him. Everything turned out okay. He had no underlying hypercoagulable issue. He had a positive ANA however, his lupus anticoagulant was not detectable.  He's had no problems with the Xarelto.  We repeated the Doppler of his left leg and the CT angiogram. Both did not show any evidence of residual thrombus.  He feels good. He's had no bleeding. He's had no change in bowel or bladder habits. He's had no leg swelling.  He is going get ready to go to for him in June. His son and grandson are out there. He is in 4 to this. He does have his compression stockings. He will drink a lot of water.  He's had no problems with rashes.  He's had no headache.  Overall, his performance status is ECOG 0.  Medications:  Current outpatient prescriptions:  .  Omega-3 Fatty Acids (FISH OIL) 1000 MG CAPS, Take 1,000 mg by mouth daily., Disp: , Rfl:  .  rivaroxaban (XARELTO) 20 MG TABS tablet, Take 1 tablet (20 mg total) by mouth daily with supper. Start on 12/22/13, Disp: 30 tablet, Rfl: 2  Allergies:  Allergies  Allergen Reactions  . Statins     Muscle aches  . Lamisil [Terbinafine Hcl] Rash    Past Medical History, Surgical history, Social history, and Family History were reviewed and updated.  Review of Systems: As above  Physical Exam:  height is 6' (1.829 m) and weight is 189 lb (85.73 kg). His oral temperature is 97.6 F (36.4 C). His blood pressure is 121/72 and his pulse is 75. His respiration is 18.   Wt Readings from Last 3 Encounters:  03/28/15 189 lb (85.73 kg)   12/27/14 187 lb (84.823 kg)  11/30/14 182 lb 4.8 oz (82.691 kg)     Well-developed and well-nourished white gentleman in no obvious distress. Head and neck exam shows no ocular or oral lesions. He has no palpable cervical or supraclavicular lymph nodes. Lungs are clear. Cardiac exam regular rate and rhythm with no murmurs, rubs or bruits. Abdomen is soft. Has good bowel sounds. There is no fluid wave. There is no palpable liver or spleen tip. Back exam shows no tenderness over the spine, ribs or hips. Extremities shows no clubbing, cyanosis or edema. No venous cords noted in the legs. Has a negative Homans sign. Skin exam shows no rashes, ecchymoses or petechia. Neurological exam is nonfocal.  Lab Results  Component Value Date   WBC 7.0 03/28/2015   HGB 15.0 03/28/2015   HCT 44.0 03/28/2015   MCV 87 03/28/2015   PLT 200 03/28/2015     Chemistry      Component Value Date/Time   NA 143 03/28/2015 0850   NA 142 12/01/2014 0935   K 4.1 03/28/2015 0850   K 3.9 12/01/2014 0935   CL 105 03/28/2015 0850   CL 102 12/01/2014 0935   CO2 29 03/28/2015 0850   CO2 24 12/01/2014 0935   BUN 16 03/28/2015 0850   BUN 14 12/01/2014 0935   CREATININE 1.1 03/28/2015 0850  CREATININE 1.26 12/01/2014 0935      Component Value Date/Time   CALCIUM 9.4 03/28/2015 0850   CALCIUM 9.7 12/01/2014 0935         Impression and Plan: George Blankenship is 69 year old gentleman with 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 These are resolved.  I still feel it 2 years anticoagulation is appropriate for him. He will complete treatment in January 2018.  I do not see that we have to do any further scans or Dopplers. I don't think these would add to our therapy.  I reiterated the need for compression stockings and a lot of water intake on his flight out to [place]. He is very well aware of this.  I will like to see him back in 6 months.   Volanda Napoleon, MD 5/10/201612:00 PM

## 2015-09-28 ENCOUNTER — Ambulatory Visit: Payer: Commercial Managed Care - HMO

## 2015-09-28 ENCOUNTER — Encounter: Payer: Self-pay | Admitting: Family

## 2015-09-28 ENCOUNTER — Other Ambulatory Visit (HOSPITAL_BASED_OUTPATIENT_CLINIC_OR_DEPARTMENT_OTHER): Payer: Commercial Managed Care - HMO

## 2015-09-28 ENCOUNTER — Ambulatory Visit (HOSPITAL_BASED_OUTPATIENT_CLINIC_OR_DEPARTMENT_OTHER): Payer: Commercial Managed Care - HMO | Admitting: Family

## 2015-09-28 VITALS — BP 130/72 | HR 67 | Temp 97.2°F | Resp 16 | Ht 72.0 in | Wt 191.0 lb

## 2015-09-28 DIAGNOSIS — I82402 Acute embolism and thrombosis of unspecified deep veins of left lower extremity: Secondary | ICD-10-CM

## 2015-09-28 DIAGNOSIS — Z23 Encounter for immunization: Secondary | ICD-10-CM | POA: Diagnosis not present

## 2015-09-28 DIAGNOSIS — I2699 Other pulmonary embolism without acute cor pulmonale: Secondary | ICD-10-CM | POA: Diagnosis not present

## 2015-09-28 LAB — CBC WITH DIFFERENTIAL (CANCER CENTER ONLY)
BASO#: 0 10*3/uL (ref 0.0–0.2)
BASO%: 0.5 % (ref 0.0–2.0)
EOS ABS: 0.1 10*3/uL (ref 0.0–0.5)
EOS%: 1.5 % (ref 0.0–7.0)
HCT: 46.8 % (ref 38.7–49.9)
HGB: 15.6 g/dL (ref 13.0–17.1)
LYMPH#: 2.9 10*3/uL (ref 0.9–3.3)
LYMPH%: 34.4 % (ref 14.0–48.0)
MCH: 29.5 pg (ref 28.0–33.4)
MCHC: 33.3 g/dL (ref 32.0–35.9)
MCV: 89 fL (ref 82–98)
MONO#: 1 10*3/uL — AB (ref 0.1–0.9)
MONO%: 11.3 % (ref 0.0–13.0)
NEUT%: 52.3 % (ref 40.0–80.0)
NEUTROS ABS: 4.4 10*3/uL (ref 1.5–6.5)
PLATELETS: 214 10*3/uL (ref 145–400)
RBC: 5.28 10*6/uL (ref 4.20–5.70)
RDW: 13.5 % (ref 11.1–15.7)
WBC: 8.4 10*3/uL (ref 4.0–10.0)

## 2015-09-28 LAB — D-DIMER, QUANTITATIVE (NOT AT ARMC): D DIMER QUANT: 0.27 ug{FEU}/mL (ref 0.00–0.48)

## 2015-09-28 MED ORDER — INFLUENZA VAC SPLIT QUAD 0.5 ML IM SUSY
0.5000 mL | PREFILLED_SYRINGE | Freq: Once | INTRAMUSCULAR | Status: AC
  Administered 2015-09-28: 0.5 mL via INTRAMUSCULAR
  Filled 2015-09-28: qty 0.5

## 2015-09-28 NOTE — Progress Notes (Signed)
Hematology and Oncology Follow Up Visit  SUNDEEP KOSKO [id number] 1946/06/22 69 y.o. 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 Faulk is here today for a follow-up. He is doing well and has no complaints at this time.  He has done well on xarelto. Scans in May of this year showed resolution of his bilateral PEs and left lower extremity DVT.  He has had no episodes of bleeding or bruising.  He denies fever, n/v, cough, rash, headache, dizziness, SOB, chest pain, palpitations, abdominal pain, changes in bowel or bladder habits.  No cramping or numbness and tingling in his extremities. He has some chronic swelling in his right ankle from an old injury and needs to have a fusion done. This would certainly be fine. He will let us know when the procedure is scheduled and we will instruct him on how to take his Xarelto during that time. No other swelling or tenderness.  He has a good appetite and is staying well hydrated.   Medications:    Medication List       This list is accurate as of: 09/28/15 11:19 AM.  Always use your most recent med list.               Fish Oil 1000 MG Caps  Take 1,000 mg by mouth daily.     rivaroxaban 20 MG Tabs tablet  Commonly known as:  XARELTO  Take 1 tablet (20 mg total) by mouth daily with supper. Start on 12/22/13        Allergies:  Allergies  Allergen Reactions  . Statins     Muscle aches  . Lamisil [Terbinafine Hcl] Rash    Past Medical History, Surgical history, Social history, and Family History were reviewed and updated.  Review of Systems: All other 10 point review of systems is negative.   Physical Exam:  height is 6' (1.829 m) and weight is 191 lb (86.637 kg). His oral temperature is 97.2 F (36.2 C). His blood pressure is 130/72 and  his pulse is 67. His respiration is 16.   Wt Readings from Last 3 Encounters:  09/28/15 191 lb (86.637 kg)  03/28/15 189 lb (85.73 kg)  12/27/14 187 lb (84.823 kg)    Ocular: Sclerae unicteric, pupils equal, round and reactive to light Ear-nose-throat: Oropharynx clear, dentition fair Lymphatic: No cervical or supraclavicular adenopathy Lungs no rales or rhonchi, good excursion bilaterally Heart regular rate and rhythm, no murmur appreciated Abd soft, nontender, positive bowel sounds MSK no focal spinal tenderness, no joint edema Neuro: non-focal, well-oriented, appropriate affect Breasts: Deferred  Lab Results  Component Value Date   WBC 8.4 09/28/2015   HGB 15.6 09/28/2015   HCT 46.8 09/28/2015   MCV 89 09/28/2015   PLT 214 09/28/2015   No results found for: FERRITIN, IRON, TIBC, UIBC, IRONPCTSAT Lab Results  Component Value Date   RBC 5.28 09/28/2015   No results found for: KPAFRELGTCHN, LAMBDASER, KAPLAMBRATIO No results found for: IGGSERUM, IGA, IGMSERUM No results found for: Ronnald Ramp, A1GS, A2GS, Violet Baldy, MSPIKE, SPEI   Chemistry      Component Value Date/Time   NA 143 03/28/2015 0850   NA 142 12/01/2014 0935   K 4.1 03/28/2015 0850   K 3.9 12/01/2014 0935  CL 105 03/28/2015 0850   CL 102 12/01/2014 0935   CO2 29 03/28/2015 0850   CO2 24 12/01/2014 0935   BUN 16 03/28/2015 0850   BUN 14 12/01/2014 0935   CREATININE 1.1 03/28/2015 0850   CREATININE 1.26 12/01/2014 0935      Component Value Date/Time   CALCIUM 9.4 03/28/2015 0850   CALCIUM 9.7 12/01/2014 0935     Impression and Plan: Mr. Meckel is 69 yo white male with history of idiopathic pulmonary emboli and thrombosis of the left leg. Scans in May showed resolution of these. He is doing well on Xarelto and has had no episodes of bleeding.  He will continue on this until January 2018 for a full 2 years of anticoagulation.  We will plan to see him back in 6 months for labs  and follow-up. We will also repeat a CT of the chest that day to reassess a left lower lobe nodule.  He will let us know when he schedules his right ankle fusion. He knows to contact us with any questions or concerns. We can certainly see him sooner if need be.    Eliezer Bottom, NP 11/10/201611:19 AM

## 2015-09-28 NOTE — Patient Instructions (Signed)

## 2016-03-28 ENCOUNTER — Ambulatory Visit: Payer: Commercial Managed Care - HMO | Admitting: Hematology & Oncology

## 2016-03-28 ENCOUNTER — Other Ambulatory Visit (HOSPITAL_BASED_OUTPATIENT_CLINIC_OR_DEPARTMENT_OTHER): Payer: Commercial Managed Care - HMO

## 2016-03-28 ENCOUNTER — Other Ambulatory Visit: Payer: Commercial Managed Care - HMO

## 2016-04-12 ENCOUNTER — Encounter: Payer: Self-pay | Admitting: Hematology & Oncology

## 2016-04-12 ENCOUNTER — Ambulatory Visit (HOSPITAL_BASED_OUTPATIENT_CLINIC_OR_DEPARTMENT_OTHER)
Admission: RE | Admit: 2016-04-12 | Discharge: 2016-04-12 | Disposition: A | Discharge status: Home / Self Care | Payer: Commercial Managed Care - HMO | Source: Ambulatory Visit | Attending: Family | Admitting: Family

## 2016-04-12 ENCOUNTER — Encounter (HOSPITAL_BASED_OUTPATIENT_CLINIC_OR_DEPARTMENT_OTHER): Payer: Self-pay

## 2016-04-12 ENCOUNTER — Other Ambulatory Visit: Payer: Self-pay | Admitting: Family

## 2016-04-12 ENCOUNTER — Ambulatory Visit (HOSPITAL_BASED_OUTPATIENT_CLINIC_OR_DEPARTMENT_OTHER): Payer: Commercial Managed Care - HMO | Admitting: Hematology & Oncology

## 2016-04-12 ENCOUNTER — Other Ambulatory Visit (HOSPITAL_BASED_OUTPATIENT_CLINIC_OR_DEPARTMENT_OTHER): Payer: Commercial Managed Care - HMO

## 2016-04-12 VITALS — BP 139/76 | HR 69 | Temp 97.5°F | Resp 16 | Ht 72.0 in | Wt 191.0 lb

## 2016-04-12 DIAGNOSIS — I700 Atherosclerosis of aorta: Secondary | ICD-10-CM | POA: Diagnosis not present

## 2016-04-12 DIAGNOSIS — Z09 Encounter for follow-up examination after completed treatment for conditions other than malignant neoplasm: Secondary | ICD-10-CM | POA: Diagnosis not present

## 2016-04-12 DIAGNOSIS — I2699 Other pulmonary embolism without acute cor pulmonale: Secondary | ICD-10-CM

## 2016-04-12 DIAGNOSIS — I82402 Acute embolism and thrombosis of unspecified deep veins of left lower extremity: Secondary | ICD-10-CM

## 2016-04-12 DIAGNOSIS — R918 Other nonspecific abnormal finding of lung field: Secondary | ICD-10-CM | POA: Diagnosis not present

## 2016-04-12 DIAGNOSIS — I2510 Atherosclerotic heart disease of native coronary artery without angina pectoris: Secondary | ICD-10-CM | POA: Insufficient documentation

## 2016-04-12 DIAGNOSIS — Z86711 Personal history of pulmonary embolism: Secondary | ICD-10-CM | POA: Insufficient documentation

## 2016-04-12 DIAGNOSIS — I2602 Saddle embolus of pulmonary artery with acute cor pulmonale: Secondary | ICD-10-CM

## 2016-04-12 LAB — CMP (CANCER CENTER ONLY)
ALT(SGPT): 32 U/L (ref 10–47)
AST: 33 U/L (ref 11–38)
Albumin: 3.7 g/dL (ref 3.3–5.5)
Alkaline Phosphatase: 67 U/L (ref 26–84)
BUN, Bld: 13 mg/dL (ref 7–22)
CO2: 28 mEq/L (ref 18–33)
Calcium: 9.6 mg/dL (ref 8.0–10.3)
Chloride: 103 mEq/L (ref 98–108)
Creat: 1.3 mg/dl — ABNORMAL HIGH (ref 0.6–1.2)
Glucose, Bld: 75 mg/dL (ref 73–118)
POTASSIUM: 4.6 meq/L (ref 3.3–4.7)
SODIUM: 142 meq/L (ref 128–145)
Total Bilirubin: 1.1 mg/dl (ref 0.20–1.60)
Total Protein: 7.2 g/dL (ref 6.4–8.1)

## 2016-04-12 LAB — CBC WITH DIFFERENTIAL (CANCER CENTER ONLY)
BASO#: 0 10*3/uL (ref 0.0–0.2)
BASO%: 0.4 % (ref 0.0–2.0)
EOS%: 2.6 % (ref 0.0–7.0)
Eosinophils Absolute: 0.2 10*3/uL (ref 0.0–0.5)
HCT: 45 % (ref 38.7–49.9)
HGB: 15.4 g/dL (ref 13.0–17.1)
LYMPH#: 2.3 10*3/uL (ref 0.9–3.3)
LYMPH%: 32.5 % (ref 14.0–48.0)
MCH: 30 pg (ref 28.0–33.4)
MCHC: 34.2 g/dL (ref 32.0–35.9)
MCV: 88 fL (ref 82–98)
MONO#: 0.8 10*3/uL (ref 0.1–0.9)
MONO%: 10.7 % (ref 0.0–13.0)
NEUT#: 3.9 10*3/uL (ref 1.5–6.5)
NEUT%: 53.8 % (ref 40.0–80.0)
Platelets: 208 10*3/uL (ref 145–400)
RBC: 5.14 10*6/uL (ref 4.20–5.70)
RDW: 13.6 % (ref 11.1–15.7)
WBC: 7.2 10*3/uL (ref 4.0–10.0)

## 2016-04-12 MED ORDER — IOPAMIDOL (ISOVUE-300) INJECTION 61%
80.0000 mL | Freq: Once | INTRAVENOUS | Status: AC | PRN
  Administered 2016-04-12: 80 mL via INTRAVENOUS

## 2016-04-12 NOTE — Progress Notes (Signed)
Hematology and Oncology Follow Up Visit  George Blankenship [id number] 1946-09-06 70 y.o. 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   He is doing well. He just back from [place]. He and his wife are visiting their grandchildren in [place]. He had a very good time out there.  He says he got a little bit of a "cold" on the plane ride. He feels he is getting better.   He has had no problems with the Xarelto. He's had no bleeding. He's had no leg pain. He's had no chest wall pain.   He's had no nausea or vomiting.  Overall, his performance status is ECOG 0.  Medications:  Current outpatient prescriptions:  .  Omega-3 Fatty Acids (FISH OIL) 1000 MG CAPS, Take 1,000 mg by mouth daily., Disp: , Rfl:  .  rivaroxaban (XARELTO) 20 MG TABS tablet, Take 1 tablet (20 mg total) by mouth daily with supper. Start on 12/22/13, Disp: 30 tablet, Rfl: 2  Allergies:  Allergies  Allergen Reactions  . Statins     Muscle aches  . Lamisil [Terbinafine Hcl] Rash    Past Medical History, Surgical history, Social history, and Family History were reviewed and updated.  Review of Systems: As above  Physical Exam:  height is 6' (1.829 m) and weight is 191 lb (86.637 kg). His oral temperature is 97.5 F (36.4 C). His blood pressure is 139/76 and his pulse is 69. His respiration is 16.   Wt Readings from Last 3 Encounters:  04/12/16 191 lb (86.637 kg)  09/28/15 191 lb (86.637 kg)  03/28/15 189 lb (85.73 kg)     Well-developed and well-nourished white gentleman in no obvious distress. Head and neck exam shows no ocular or oral lesions. He has no palpable cervical or supraclavicular lymph nodes. Lungs are clear. Cardiac exam regular rate and rhythm with no  murmurs, rubs or bruits. Abdomen is soft. Has good bowel sounds. There is no fluid wave. There is no palpable liver or spleen tip. Back exam shows no tenderness over the spine, ribs or hips. Extremities shows no clubbing, cyanosis or edema. No venous cords noted in the legs. Has a negative Homans sign. Skin exam shows no rashes, ecchymoses or petechia. Neurological exam is nonfocal.  Lab Results  Component Value Date   WBC 7.2 04/12/2016   HGB 15.4 04/12/2016   HCT 45.0 04/12/2016   MCV 88 04/12/2016   PLT 208 04/12/2016     Chemistry      Component Value Date/Time   NA 142 04/12/2016 1010   NA 142 12/01/2014 0935   K 4.6 04/12/2016 1010   K 3.9 12/01/2014 0935   CL 103 04/12/2016 1010   CL 102 12/01/2014 0935   CO2 28 04/12/2016 1010   CO2 24 12/01/2014 0935   BUN 13 04/12/2016 1010   BUN 14 12/01/2014 0935   CREATININE 1.3* 04/12/2016 1010   CREATININE 1.26 12/01/2014 0935      Component Value Date/Time   CALCIUM 9.6 04/12/2016 1010   CALCIUM 9.7 12/01/2014 0935   ALKPHOS 67 04/12/2016 1010   AST 33 04/12/2016 1010   ALT 32 04/12/2016 1010   BILITOT 1.10 04/12/2016 1010  Impression and Plan: George Blankenship is 70 year old gentleman with 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 These are resolved.  I still feel it 2 years anticoagulation is appropriate for him. He will complete treatment in January 2018.  I talked to him about maintenance Xarelto. Recent studies come out that shown that maintenance Xarelto 10 mg a day for 1 year after completion of therapeutic anticoagulation decreases the risk of blood clots by 70%. The risk of bleeding is only 3%. I think this is a very good "risk-benefit ratio".  I will like to see Ms. Mckendry back in January. At that point in time, we will switch him over to 10 mg.   We have to repeat his CT of the chest in about 1 year or so. I think it this next CT scan looks okay, then we can hold off any further  scans.   I spent about 25-30 minutes with him.   Volanda Napoleon, MD 5/26/201712:14 PM

## 2016-04-13 LAB — D-DIMER, QUANTITATIVE: D-DIMER: <0.2 mg/L FEU (ref 0.00–0.49)

## 2016-06-04 ENCOUNTER — Other Ambulatory Visit: Payer: Self-pay | Admitting: Nurse Practitioner

## 2016-06-04 MED ORDER — RIVAROXABAN 10 MG PO TABS: 10.0000 mg | ORAL_TABLET | Freq: Every day | ORAL | Status: DC

## 2016-06-25 ENCOUNTER — Other Ambulatory Visit: Payer: Self-pay | Admitting: Orthopedic Surgery

## 2016-06-25 DIAGNOSIS — M19171 Post-traumatic osteoarthritis, right ankle and foot: Secondary | ICD-10-CM

## 2016-07-04 ENCOUNTER — Ambulatory Visit
Admission: RE | Admit: 2016-07-04 | Discharge: 2016-07-04 | Disposition: A | Discharge status: Home / Self Care | Payer: Commercial Managed Care - HMO | Source: Ambulatory Visit | Attending: Orthopedic Surgery | Admitting: Orthopedic Surgery

## 2016-07-04 DIAGNOSIS — M19171 Post-traumatic osteoarthritis, right ankle and foot: Secondary | ICD-10-CM

## 2016-10-21 ENCOUNTER — Telehealth: Payer: Self-pay | Admitting: *Deleted

## 2016-10-21 NOTE — Telephone Encounter (Signed)
Patient is currently on Xarelto 20mg  daily. This is due to be completed in January 2018. The patient will run out of his current supply in about 2 weeks. He would like to know if he can go ahead and stop Xarelto when the supply runs out. His insurance is no longer covering the medication.  He has an appointment already scheduled for January 5th.   Dr Marin Olp is fine with the patient stopping the Xarelto when the supply runs out. He is to then start taking 2 baby aspirin daily. Dr Marin Olp will reassess when the patient is seen on 11/23/15.  Patient understands instructions. Verified with teachback.

## 2016-11-19 DIAGNOSIS — Z0100 Encounter for examination of eyes and vision without abnormal findings: Secondary | ICD-10-CM | POA: Diagnosis not present

## 2016-11-19 DIAGNOSIS — H52223 Regular astigmatism, bilateral: Secondary | ICD-10-CM | POA: Diagnosis not present

## 2016-11-22 ENCOUNTER — Ambulatory Visit (HOSPITAL_BASED_OUTPATIENT_CLINIC_OR_DEPARTMENT_OTHER): Payer: Medicare HMO | Admitting: Hematology & Oncology

## 2016-11-22 ENCOUNTER — Other Ambulatory Visit (HOSPITAL_BASED_OUTPATIENT_CLINIC_OR_DEPARTMENT_OTHER): Payer: Medicare HMO

## 2016-11-22 VITALS — BP 120/68 | HR 68 | Temp 97.5°F | Resp 16 | Wt 196.0 lb

## 2016-11-22 DIAGNOSIS — I2602 Saddle embolus of pulmonary artery with acute cor pulmonale: Secondary | ICD-10-CM

## 2016-11-22 DIAGNOSIS — I82402 Acute embolism and thrombosis of unspecified deep veins of left lower extremity: Secondary | ICD-10-CM | POA: Diagnosis not present

## 2016-11-22 DIAGNOSIS — I2782 Chronic pulmonary embolism: Secondary | ICD-10-CM | POA: Diagnosis not present

## 2016-11-22 DIAGNOSIS — Z7982 Long term (current) use of aspirin: Secondary | ICD-10-CM

## 2016-11-22 LAB — COMPREHENSIVE METABOLIC PANEL
ALBUMIN: 4 g/dL (ref 3.5–5.0)
ALT: 23 U/L (ref 0–55)
AST: 31 U/L (ref 5–34)
Alkaline Phosphatase: 86 U/L (ref 40–150)
Anion Gap: 7 mEq/L (ref 3–11)
BILIRUBIN TOTAL: 0.69 mg/dL (ref 0.20–1.20)
BUN: 15.8 mg/dL (ref 7.0–26.0)
CALCIUM: 9.5 mg/dL (ref 8.4–10.4)
CO2: 26 meq/L (ref 22–29)
CREATININE: 1.2 mg/dL (ref 0.7–1.3)
Chloride: 104 mEq/L (ref 98–109)
EGFR: 62 mL/min/{1.73_m2} — AB (ref >90)
Glucose: 73 mg/dl (ref 70–140)
Potassium: 4.4 mEq/L (ref 3.5–5.1)
Sodium: 138 mEq/L (ref 136–145)
TOTAL PROTEIN: 7.4 g/dL (ref 6.4–8.3)

## 2016-11-22 LAB — CBC WITH DIFFERENTIAL (CANCER CENTER ONLY)
BASO#: 0 10*3/uL (ref 0.0–0.2)
BASO%: 0.4 % (ref 0.0–2.0)
EOS%: 1.9 % (ref 0.0–7.0)
Eosinophils Absolute: 0.2 10*3/uL (ref 0.0–0.5)
HEMATOCRIT: 44.9 % (ref 38.7–49.9)
HEMOGLOBIN: 15.2 g/dL (ref 13.0–17.1)
LYMPH#: 2.5 10*3/uL (ref 0.9–3.3)
LYMPH%: 32.1 % (ref 14.0–48.0)
MCH: 30 pg (ref 28.0–33.4)
MCHC: 33.9 g/dL (ref 32.0–35.9)
MCV: 89 fL (ref 82–98)
MONO#: 1.3 10*3/uL — AB (ref 0.1–0.9)
MONO%: 16.3 % — ABNORMAL HIGH (ref 0.0–13.0)
NEUT%: 49.3 % (ref 40.0–80.0)
NEUTROS ABS: 3.9 10*3/uL (ref 1.5–6.5)
PLATELETS: 200 10*3/uL (ref 145–400)
RBC: 5.07 10*6/uL (ref 4.20–5.70)
RDW: 13.9 % (ref 11.1–15.7)
WBC: 7.8 10*3/uL (ref 4.0–10.0)

## 2016-11-22 NOTE — Progress Notes (Signed)
Hematology and Oncology Follow Up Visit  George Blankenship [id number] 01-06-1946 71 y.o. 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   He is doing well. As always, he was out in [place]. He was out there for Christmas. He has done incredibly well. He really has had no problems since we started seeing him.  He's been on Xarelto now for 2 years. We will switch him over to aspirin now.   He has had no problems with bleeding or bruising. He's had no problems with nausea or vomiting. He's had no problems with rashes. There's been no leg swelling. He's had no chest wall pain. He's had no fever.   Overall, his performance status is ECOG 0.  Medications:  Current Outpatient Prescriptions:  .  Omega-3 Fatty Acids (FISH OIL) 1000 MG CAPS, Take 1,000 mg by mouth daily., Disp: , Rfl:  .  rivaroxaban (XARELTO) 10 MG TABS tablet, Take 1 tablet (10 mg total) by mouth daily with supper., Disp: 30 tablet, Rfl: 4  Allergies:  Allergies  Allergen Reactions  . Statins Anaphylaxis    Muscle aches Muscle aches  . Lamisil [Terbinafine Hcl] Rash    Past Medical History, Surgical history, Social history, and Family History were reviewed and updated.  Review of Systems: As above  Physical Exam:  weight is 196 lb (88.9 kg). His oral temperature is 97.5 F (36.4 C). His blood pressure is 120/68 and his pulse is 68. His respiration is 16 and oxygen saturation is 99%.   Wt Readings from Last 3 Encounters:  11/22/16 196 lb (88.9 kg)  04/12/16 191 lb (86.6 kg)  09/28/15 191 lb (86.6 kg)     Well-developed and well-nourished white gentleman in no obvious distress. Head and neck exam shows no ocular or oral lesions. He has no  palpable cervical or supraclavicular lymph nodes. Lungs are clear. Cardiac exam regular rate and rhythm with no murmurs, rubs or bruits. Abdomen is soft. Has good bowel sounds. There is no fluid wave. There is no palpable liver or spleen tip. Back exam shows no tenderness over the spine, ribs or hips. Extremities shows no clubbing, cyanosis or edema. No venous cords noted in the legs. Has a negative Homans sign. Skin exam shows no rashes, ecchymoses or petechia. Neurological exam is nonfocal.  Lab Results  Component Value Date   WBC 7.8 11/22/2016   HGB 15.2 11/22/2016   HCT 44.9 11/22/2016   MCV 89 11/22/2016   PLT 200 11/22/2016     Chemistry      Component Value Date/Time   NA 142 04/12/2016 1010   K 4.6 04/12/2016 1010   CL 103 04/12/2016 1010   CO2 28 04/12/2016 1010   BUN 13 04/12/2016 1010   CREATININE 1.3 (H) 04/12/2016 1010      Component Value Date/Time   CALCIUM 9.6 04/12/2016 1010   ALKPHOS 67 04/12/2016 1010   AST 33 04/12/2016 1010   ALT 32 04/12/2016 1010   BILITOT 1.10 04/12/2016 1010         Impression and Plan: George Blankenship is 71 year old gentleman with 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 These are resolved.  He  has finished 2 years of anticoagulation. As such, I think we get him onto baby aspirin now. I think that 2 baby aspirin daily would be reasonable. I think this would be helpful.  I realize that he does travel out to [place] quite a bit. He is very proactive and drinks a lot of water on the flight.  Again, I really think that 2 baby aspirin would be a good way to continue to minimize his risk of recurrence.   I don't think we have to see him back in the office.  I talked to him. I told him that we would be more happy to see him back if he has any problems.  Volanda Napoleon, MD 1/5/201810:36 AM

## 2016-11-25 DIAGNOSIS — R69 Illness, unspecified: Secondary | ICD-10-CM | POA: Diagnosis not present

## 2016-12-03 DIAGNOSIS — Z1283 Encounter for screening for malignant neoplasm of skin: Secondary | ICD-10-CM | POA: Diagnosis not present

## 2016-12-03 DIAGNOSIS — Z08 Encounter for follow-up examination after completed treatment for malignant neoplasm: Secondary | ICD-10-CM | POA: Diagnosis not present

## 2016-12-03 DIAGNOSIS — Z85820 Personal history of malignant melanoma of skin: Secondary | ICD-10-CM | POA: Diagnosis not present

## 2016-12-31 DIAGNOSIS — J069 Acute upper respiratory infection, unspecified: Secondary | ICD-10-CM | POA: Diagnosis not present

## 2017-02-12 DIAGNOSIS — Z85820 Personal history of malignant melanoma of skin: Secondary | ICD-10-CM | POA: Diagnosis not present

## 2017-02-12 DIAGNOSIS — Z1283 Encounter for screening for malignant neoplasm of skin: Secondary | ICD-10-CM | POA: Diagnosis not present

## 2017-02-12 DIAGNOSIS — X32XXXD Exposure to sunlight, subsequent encounter: Secondary | ICD-10-CM | POA: Diagnosis not present

## 2017-02-12 DIAGNOSIS — Z08 Encounter for follow-up examination after completed treatment for malignant neoplasm: Secondary | ICD-10-CM | POA: Diagnosis not present

## 2017-02-12 DIAGNOSIS — L570 Actinic keratosis: Secondary | ICD-10-CM | POA: Diagnosis not present

## 2017-02-12 DIAGNOSIS — L821 Other seborrheic keratosis: Secondary | ICD-10-CM | POA: Diagnosis not present

## 2017-02-13 DIAGNOSIS — M19071 Primary osteoarthritis, right ankle and foot: Secondary | ICD-10-CM | POA: Diagnosis not present

## 2017-02-13 DIAGNOSIS — Z9889 Other specified postprocedural states: Secondary | ICD-10-CM | POA: Diagnosis not present

## 2017-02-13 DIAGNOSIS — Z883 Allergy status to other anti-infective agents status: Secondary | ICD-10-CM | POA: Diagnosis not present

## 2017-02-13 DIAGNOSIS — Z8781 Personal history of (healed) traumatic fracture: Secondary | ICD-10-CM | POA: Diagnosis not present

## 2017-02-13 DIAGNOSIS — Z888 Allergy status to other drugs, medicaments and biological substances status: Secondary | ICD-10-CM | POA: Diagnosis not present

## 2017-03-04 DIAGNOSIS — R3915 Urgency of urination: Secondary | ICD-10-CM | POA: Diagnosis not present

## 2017-03-04 DIAGNOSIS — G4700 Insomnia, unspecified: Secondary | ICD-10-CM | POA: Diagnosis not present

## 2017-03-04 DIAGNOSIS — Z7982 Long term (current) use of aspirin: Secondary | ICD-10-CM | POA: Diagnosis not present

## 2017-03-04 DIAGNOSIS — M13171 Monoarthritis, not elsewhere classified, right ankle and foot: Secondary | ICD-10-CM | POA: Diagnosis not present

## 2017-03-04 DIAGNOSIS — R1915 Other abnormal bowel sounds: Secondary | ICD-10-CM | POA: Diagnosis not present

## 2017-03-04 DIAGNOSIS — N401 Enlarged prostate with lower urinary tract symptoms: Secondary | ICD-10-CM | POA: Diagnosis not present

## 2017-03-04 DIAGNOSIS — Z0000 Encounter for general adult medical examination without abnormal findings: Secondary | ICD-10-CM | POA: Diagnosis not present

## 2017-03-04 DIAGNOSIS — Z6826 Body mass index (BMI) 26.0-26.9, adult: Secondary | ICD-10-CM | POA: Diagnosis not present

## 2017-03-04 DIAGNOSIS — R3912 Poor urinary stream: Secondary | ICD-10-CM | POA: Diagnosis not present

## 2017-03-04 DIAGNOSIS — J302 Other seasonal allergic rhinitis: Secondary | ICD-10-CM | POA: Diagnosis not present

## 2017-03-20 DIAGNOSIS — M19071 Primary osteoarthritis, right ankle and foot: Secondary | ICD-10-CM | POA: Diagnosis not present

## 2017-05-24 DIAGNOSIS — S90221A Contusion of right lesser toe(s) with damage to nail, initial encounter: Secondary | ICD-10-CM | POA: Diagnosis not present

## 2017-07-01 DIAGNOSIS — Z1283 Encounter for screening for malignant neoplasm of skin: Secondary | ICD-10-CM | POA: Diagnosis not present

## 2017-07-01 DIAGNOSIS — B351 Tinea unguium: Secondary | ICD-10-CM | POA: Diagnosis not present

## 2017-07-01 DIAGNOSIS — L821 Other seborrheic keratosis: Secondary | ICD-10-CM | POA: Diagnosis not present

## 2017-07-01 DIAGNOSIS — Z85820 Personal history of malignant melanoma of skin: Secondary | ICD-10-CM | POA: Diagnosis not present

## 2017-07-01 DIAGNOSIS — Z08 Encounter for follow-up examination after completed treatment for malignant neoplasm: Secondary | ICD-10-CM | POA: Diagnosis not present

## 2017-07-01 DIAGNOSIS — L570 Actinic keratosis: Secondary | ICD-10-CM | POA: Diagnosis not present

## 2017-07-01 DIAGNOSIS — X32XXXD Exposure to sunlight, subsequent encounter: Secondary | ICD-10-CM | POA: Diagnosis not present

## 2017-07-07 DIAGNOSIS — R69 Illness, unspecified: Secondary | ICD-10-CM | POA: Diagnosis not present

## 2017-09-16 DIAGNOSIS — H6501 Acute serous otitis media, right ear: Secondary | ICD-10-CM | POA: Diagnosis not present

## 2017-09-16 DIAGNOSIS — R05 Cough: Secondary | ICD-10-CM | POA: Diagnosis not present

## 2017-10-01 DIAGNOSIS — Z85820 Personal history of malignant melanoma of skin: Secondary | ICD-10-CM | POA: Diagnosis not present

## 2017-10-01 DIAGNOSIS — L821 Other seborrheic keratosis: Secondary | ICD-10-CM | POA: Diagnosis not present

## 2017-10-01 DIAGNOSIS — Z08 Encounter for follow-up examination after completed treatment for malignant neoplasm: Secondary | ICD-10-CM | POA: Diagnosis not present

## 2017-10-01 DIAGNOSIS — X32XXXD Exposure to sunlight, subsequent encounter: Secondary | ICD-10-CM | POA: Diagnosis not present

## 2017-10-01 DIAGNOSIS — L570 Actinic keratosis: Secondary | ICD-10-CM | POA: Diagnosis not present

## 2017-10-01 DIAGNOSIS — Z1283 Encounter for screening for malignant neoplasm of skin: Secondary | ICD-10-CM | POA: Diagnosis not present

## 2017-10-23 DIAGNOSIS — R69 Illness, unspecified: Secondary | ICD-10-CM | POA: Diagnosis not present

## 2017-11-24 DIAGNOSIS — M19071 Primary osteoarthritis, right ankle and foot: Secondary | ICD-10-CM | POA: Diagnosis not present

## 2017-11-27 DIAGNOSIS — N401 Enlarged prostate with lower urinary tract symptoms: Secondary | ICD-10-CM | POA: Diagnosis not present

## 2017-11-27 DIAGNOSIS — Z79899 Other long term (current) drug therapy: Secondary | ICD-10-CM | POA: Diagnosis not present

## 2017-11-27 DIAGNOSIS — Z86711 Personal history of pulmonary embolism: Secondary | ICD-10-CM | POA: Diagnosis not present

## 2017-11-27 DIAGNOSIS — E663 Overweight: Secondary | ICD-10-CM | POA: Diagnosis not present

## 2017-11-27 DIAGNOSIS — R972 Elevated prostate specific antigen [PSA]: Secondary | ICD-10-CM | POA: Diagnosis not present

## 2017-11-27 DIAGNOSIS — Z86010 Personal history of colonic polyps: Secondary | ICD-10-CM | POA: Diagnosis not present

## 2017-11-27 DIAGNOSIS — Z0000 Encounter for general adult medical examination without abnormal findings: Secondary | ICD-10-CM | POA: Diagnosis not present

## 2017-11-27 DIAGNOSIS — Z23 Encounter for immunization: Secondary | ICD-10-CM | POA: Diagnosis not present

## 2017-11-27 DIAGNOSIS — Z85820 Personal history of malignant melanoma of skin: Secondary | ICD-10-CM | POA: Diagnosis not present

## 2017-11-27 DIAGNOSIS — E7800 Pure hypercholesterolemia, unspecified: Secondary | ICD-10-CM | POA: Diagnosis not present

## 2017-12-10 DIAGNOSIS — G8929 Other chronic pain: Secondary | ICD-10-CM | POA: Diagnosis not present

## 2017-12-10 DIAGNOSIS — M25571 Pain in right ankle and joints of right foot: Secondary | ICD-10-CM | POA: Diagnosis not present

## 2017-12-10 DIAGNOSIS — M19071 Primary osteoarthritis, right ankle and foot: Secondary | ICD-10-CM | POA: Diagnosis not present

## 2017-12-10 DIAGNOSIS — M25671 Stiffness of right ankle, not elsewhere classified: Secondary | ICD-10-CM | POA: Diagnosis not present

## 2017-12-10 DIAGNOSIS — R2689 Other abnormalities of gait and mobility: Secondary | ICD-10-CM | POA: Diagnosis not present

## 2017-12-17 DIAGNOSIS — M25671 Stiffness of right ankle, not elsewhere classified: Secondary | ICD-10-CM | POA: Diagnosis not present

## 2017-12-17 DIAGNOSIS — R2689 Other abnormalities of gait and mobility: Secondary | ICD-10-CM | POA: Diagnosis not present

## 2017-12-17 DIAGNOSIS — G8929 Other chronic pain: Secondary | ICD-10-CM | POA: Diagnosis not present

## 2017-12-17 DIAGNOSIS — M19071 Primary osteoarthritis, right ankle and foot: Secondary | ICD-10-CM | POA: Diagnosis not present

## 2017-12-17 DIAGNOSIS — M25571 Pain in right ankle and joints of right foot: Secondary | ICD-10-CM | POA: Diagnosis not present

## 2017-12-18 DIAGNOSIS — M25671 Stiffness of right ankle, not elsewhere classified: Secondary | ICD-10-CM | POA: Diagnosis not present

## 2017-12-18 DIAGNOSIS — G8929 Other chronic pain: Secondary | ICD-10-CM | POA: Diagnosis not present

## 2017-12-18 DIAGNOSIS — M19071 Primary osteoarthritis, right ankle and foot: Secondary | ICD-10-CM | POA: Diagnosis not present

## 2017-12-18 DIAGNOSIS — R2689 Other abnormalities of gait and mobility: Secondary | ICD-10-CM | POA: Diagnosis not present

## 2017-12-18 DIAGNOSIS — M25571 Pain in right ankle and joints of right foot: Secondary | ICD-10-CM | POA: Diagnosis not present

## 2017-12-22 DIAGNOSIS — M25671 Stiffness of right ankle, not elsewhere classified: Secondary | ICD-10-CM | POA: Diagnosis not present

## 2017-12-22 DIAGNOSIS — R2689 Other abnormalities of gait and mobility: Secondary | ICD-10-CM | POA: Diagnosis not present

## 2017-12-22 DIAGNOSIS — G8929 Other chronic pain: Secondary | ICD-10-CM | POA: Diagnosis not present

## 2017-12-22 DIAGNOSIS — M25571 Pain in right ankle and joints of right foot: Secondary | ICD-10-CM | POA: Diagnosis not present

## 2017-12-22 DIAGNOSIS — M19071 Primary osteoarthritis, right ankle and foot: Secondary | ICD-10-CM | POA: Diagnosis not present

## 2017-12-25 DIAGNOSIS — M25671 Stiffness of right ankle, not elsewhere classified: Secondary | ICD-10-CM | POA: Diagnosis not present

## 2017-12-25 DIAGNOSIS — M19071 Primary osteoarthritis, right ankle and foot: Secondary | ICD-10-CM | POA: Diagnosis not present

## 2017-12-25 DIAGNOSIS — G8929 Other chronic pain: Secondary | ICD-10-CM | POA: Diagnosis not present

## 2017-12-25 DIAGNOSIS — M25571 Pain in right ankle and joints of right foot: Secondary | ICD-10-CM | POA: Diagnosis not present

## 2017-12-25 DIAGNOSIS — R2689 Other abnormalities of gait and mobility: Secondary | ICD-10-CM | POA: Diagnosis not present

## 2017-12-30 DIAGNOSIS — M19071 Primary osteoarthritis, right ankle and foot: Secondary | ICD-10-CM | POA: Diagnosis not present

## 2017-12-30 DIAGNOSIS — G8929 Other chronic pain: Secondary | ICD-10-CM | POA: Diagnosis not present

## 2017-12-30 DIAGNOSIS — M25571 Pain in right ankle and joints of right foot: Secondary | ICD-10-CM | POA: Diagnosis not present

## 2017-12-30 DIAGNOSIS — R2689 Other abnormalities of gait and mobility: Secondary | ICD-10-CM | POA: Diagnosis not present

## 2017-12-30 DIAGNOSIS — M25671 Stiffness of right ankle, not elsewhere classified: Secondary | ICD-10-CM | POA: Diagnosis not present

## 2018-01-01 DIAGNOSIS — M25671 Stiffness of right ankle, not elsewhere classified: Secondary | ICD-10-CM | POA: Diagnosis not present

## 2018-01-01 DIAGNOSIS — M19071 Primary osteoarthritis, right ankle and foot: Secondary | ICD-10-CM | POA: Diagnosis not present

## 2018-01-01 DIAGNOSIS — R2689 Other abnormalities of gait and mobility: Secondary | ICD-10-CM | POA: Diagnosis not present

## 2018-01-01 DIAGNOSIS — M25571 Pain in right ankle and joints of right foot: Secondary | ICD-10-CM | POA: Diagnosis not present

## 2018-01-01 DIAGNOSIS — G8929 Other chronic pain: Secondary | ICD-10-CM | POA: Diagnosis not present

## 2018-01-06 DIAGNOSIS — G8929 Other chronic pain: Secondary | ICD-10-CM | POA: Diagnosis not present

## 2018-01-06 DIAGNOSIS — R2689 Other abnormalities of gait and mobility: Secondary | ICD-10-CM | POA: Diagnosis not present

## 2018-01-06 DIAGNOSIS — M25571 Pain in right ankle and joints of right foot: Secondary | ICD-10-CM | POA: Diagnosis not present

## 2018-01-06 DIAGNOSIS — M19071 Primary osteoarthritis, right ankle and foot: Secondary | ICD-10-CM | POA: Diagnosis not present

## 2018-01-06 DIAGNOSIS — M25671 Stiffness of right ankle, not elsewhere classified: Secondary | ICD-10-CM | POA: Diagnosis not present

## 2018-01-13 DIAGNOSIS — R2689 Other abnormalities of gait and mobility: Secondary | ICD-10-CM | POA: Diagnosis not present

## 2018-01-13 DIAGNOSIS — M25571 Pain in right ankle and joints of right foot: Secondary | ICD-10-CM | POA: Diagnosis not present

## 2018-01-13 DIAGNOSIS — G8929 Other chronic pain: Secondary | ICD-10-CM | POA: Diagnosis not present

## 2018-01-13 DIAGNOSIS — M25671 Stiffness of right ankle, not elsewhere classified: Secondary | ICD-10-CM | POA: Diagnosis not present

## 2018-01-13 DIAGNOSIS — M19071 Primary osteoarthritis, right ankle and foot: Secondary | ICD-10-CM | POA: Diagnosis not present

## 2018-01-15 DIAGNOSIS — M25671 Stiffness of right ankle, not elsewhere classified: Secondary | ICD-10-CM | POA: Diagnosis not present

## 2018-01-15 DIAGNOSIS — R2689 Other abnormalities of gait and mobility: Secondary | ICD-10-CM | POA: Diagnosis not present

## 2018-01-15 DIAGNOSIS — M25571 Pain in right ankle and joints of right foot: Secondary | ICD-10-CM | POA: Diagnosis not present

## 2018-01-15 DIAGNOSIS — M19071 Primary osteoarthritis, right ankle and foot: Secondary | ICD-10-CM | POA: Diagnosis not present

## 2018-01-15 DIAGNOSIS — G8929 Other chronic pain: Secondary | ICD-10-CM | POA: Diagnosis not present

## 2018-01-19 DIAGNOSIS — G8929 Other chronic pain: Secondary | ICD-10-CM | POA: Diagnosis not present

## 2018-01-19 DIAGNOSIS — R2689 Other abnormalities of gait and mobility: Secondary | ICD-10-CM | POA: Diagnosis not present

## 2018-01-19 DIAGNOSIS — M25671 Stiffness of right ankle, not elsewhere classified: Secondary | ICD-10-CM | POA: Diagnosis not present

## 2018-01-19 DIAGNOSIS — M25571 Pain in right ankle and joints of right foot: Secondary | ICD-10-CM | POA: Diagnosis not present

## 2018-01-19 DIAGNOSIS — M19071 Primary osteoarthritis, right ankle and foot: Secondary | ICD-10-CM | POA: Diagnosis not present

## 2018-01-21 DIAGNOSIS — M25671 Stiffness of right ankle, not elsewhere classified: Secondary | ICD-10-CM | POA: Diagnosis not present

## 2018-01-21 DIAGNOSIS — R2689 Other abnormalities of gait and mobility: Secondary | ICD-10-CM | POA: Diagnosis not present

## 2018-01-21 DIAGNOSIS — M19071 Primary osteoarthritis, right ankle and foot: Secondary | ICD-10-CM | POA: Diagnosis not present

## 2018-01-21 DIAGNOSIS — G8929 Other chronic pain: Secondary | ICD-10-CM | POA: Diagnosis not present

## 2018-01-21 DIAGNOSIS — M25571 Pain in right ankle and joints of right foot: Secondary | ICD-10-CM | POA: Diagnosis not present

## 2018-04-24 DIAGNOSIS — Z08 Encounter for follow-up examination after completed treatment for malignant neoplasm: Secondary | ICD-10-CM | POA: Diagnosis not present

## 2018-04-24 DIAGNOSIS — L820 Inflamed seborrheic keratosis: Secondary | ICD-10-CM | POA: Diagnosis not present

## 2018-04-24 DIAGNOSIS — L821 Other seborrheic keratosis: Secondary | ICD-10-CM | POA: Diagnosis not present

## 2018-04-24 DIAGNOSIS — D225 Melanocytic nevi of trunk: Secondary | ICD-10-CM | POA: Diagnosis not present

## 2018-04-24 DIAGNOSIS — L570 Actinic keratosis: Secondary | ICD-10-CM | POA: Diagnosis not present

## 2018-04-24 DIAGNOSIS — Z1283 Encounter for screening for malignant neoplasm of skin: Secondary | ICD-10-CM | POA: Diagnosis not present

## 2018-04-24 DIAGNOSIS — X32XXXD Exposure to sunlight, subsequent encounter: Secondary | ICD-10-CM | POA: Diagnosis not present

## 2018-04-24 DIAGNOSIS — Z85820 Personal history of malignant melanoma of skin: Secondary | ICD-10-CM | POA: Diagnosis not present

## 2018-05-23 DIAGNOSIS — B3749 Other urogenital candidiasis: Secondary | ICD-10-CM | POA: Diagnosis not present

## 2018-08-16 DIAGNOSIS — R69 Illness, unspecified: Secondary | ICD-10-CM | POA: Diagnosis not present

## 2018-09-15 DIAGNOSIS — R69 Illness, unspecified: Secondary | ICD-10-CM | POA: Diagnosis not present

## 2018-10-13 DIAGNOSIS — Z888 Allergy status to other drugs, medicaments and biological substances status: Secondary | ICD-10-CM | POA: Diagnosis not present

## 2018-10-13 DIAGNOSIS — M19071 Primary osteoarthritis, right ankle and foot: Secondary | ICD-10-CM | POA: Diagnosis not present

## 2018-10-14 DIAGNOSIS — X32XXXD Exposure to sunlight, subsequent encounter: Secondary | ICD-10-CM | POA: Diagnosis not present

## 2018-10-14 DIAGNOSIS — Z1283 Encounter for screening for malignant neoplasm of skin: Secondary | ICD-10-CM | POA: Diagnosis not present

## 2018-10-14 DIAGNOSIS — Z08 Encounter for follow-up examination after completed treatment for malignant neoplasm: Secondary | ICD-10-CM | POA: Diagnosis not present

## 2018-10-14 DIAGNOSIS — Z85820 Personal history of malignant melanoma of skin: Secondary | ICD-10-CM | POA: Diagnosis not present

## 2018-10-14 DIAGNOSIS — L821 Other seborrheic keratosis: Secondary | ICD-10-CM | POA: Diagnosis not present

## 2018-10-14 DIAGNOSIS — L570 Actinic keratosis: Secondary | ICD-10-CM | POA: Diagnosis not present

## 2019-01-01 DIAGNOSIS — Z0000 Encounter for general adult medical examination without abnormal findings: Secondary | ICD-10-CM | POA: Diagnosis not present

## 2019-01-01 DIAGNOSIS — R7301 Impaired fasting glucose: Secondary | ICD-10-CM | POA: Diagnosis not present

## 2019-01-01 DIAGNOSIS — Z86711 Personal history of pulmonary embolism: Secondary | ICD-10-CM | POA: Diagnosis not present

## 2019-01-01 DIAGNOSIS — Z125 Encounter for screening for malignant neoplasm of prostate: Secondary | ICD-10-CM | POA: Diagnosis not present

## 2019-01-01 DIAGNOSIS — E7800 Pure hypercholesterolemia, unspecified: Secondary | ICD-10-CM | POA: Diagnosis not present

## 2019-01-01 DIAGNOSIS — N401 Enlarged prostate with lower urinary tract symptoms: Secondary | ICD-10-CM | POA: Diagnosis not present

## 2019-01-01 DIAGNOSIS — Z1159 Encounter for screening for other viral diseases: Secondary | ICD-10-CM | POA: Diagnosis not present

## 2019-01-01 DIAGNOSIS — Z79899 Other long term (current) drug therapy: Secondary | ICD-10-CM | POA: Diagnosis not present

## 2019-01-01 DIAGNOSIS — Z86010 Personal history of colonic polyps: Secondary | ICD-10-CM | POA: Diagnosis not present

## 2019-01-01 DIAGNOSIS — R69 Illness, unspecified: Secondary | ICD-10-CM | POA: Diagnosis not present

## 2019-01-01 DIAGNOSIS — Z85820 Personal history of malignant melanoma of skin: Secondary | ICD-10-CM | POA: Diagnosis not present

## 2019-06-10 DIAGNOSIS — H52223 Regular astigmatism, bilateral: Secondary | ICD-10-CM | POA: Diagnosis not present

## 2019-07-02 DIAGNOSIS — L304 Erythema intertrigo: Secondary | ICD-10-CM | POA: Diagnosis not present

## 2019-07-02 DIAGNOSIS — Z85820 Personal history of malignant melanoma of skin: Secondary | ICD-10-CM | POA: Diagnosis not present

## 2019-07-02 DIAGNOSIS — D225 Melanocytic nevi of trunk: Secondary | ICD-10-CM | POA: Diagnosis not present

## 2019-07-02 DIAGNOSIS — Z08 Encounter for follow-up examination after completed treatment for malignant neoplasm: Secondary | ICD-10-CM | POA: Diagnosis not present

## 2019-07-02 DIAGNOSIS — Z1283 Encounter for screening for malignant neoplasm of skin: Secondary | ICD-10-CM | POA: Diagnosis not present

## 2019-07-02 DIAGNOSIS — B351 Tinea unguium: Secondary | ICD-10-CM | POA: Diagnosis not present

## 2019-07-02 DIAGNOSIS — L820 Inflamed seborrheic keratosis: Secondary | ICD-10-CM | POA: Diagnosis not present

## 2019-07-30 DIAGNOSIS — Z23 Encounter for immunization: Secondary | ICD-10-CM | POA: Diagnosis not present

## 2019-10-01 DIAGNOSIS — M19071 Primary osteoarthritis, right ankle and foot: Secondary | ICD-10-CM | POA: Diagnosis not present

## 2019-10-04 DIAGNOSIS — G8929 Other chronic pain: Secondary | ICD-10-CM | POA: Diagnosis not present

## 2019-10-04 DIAGNOSIS — R29898 Other symptoms and signs involving the musculoskeletal system: Secondary | ICD-10-CM | POA: Diagnosis not present

## 2019-10-04 DIAGNOSIS — M19071 Primary osteoarthritis, right ankle and foot: Secondary | ICD-10-CM | POA: Diagnosis not present

## 2019-10-04 DIAGNOSIS — M25571 Pain in right ankle and joints of right foot: Secondary | ICD-10-CM | POA: Diagnosis not present

## 2019-10-04 DIAGNOSIS — R262 Difficulty in walking, not elsewhere classified: Secondary | ICD-10-CM | POA: Diagnosis not present

## 2019-10-04 DIAGNOSIS — M25671 Stiffness of right ankle, not elsewhere classified: Secondary | ICD-10-CM | POA: Diagnosis not present

## 2019-10-06 DIAGNOSIS — R29898 Other symptoms and signs involving the musculoskeletal system: Secondary | ICD-10-CM | POA: Diagnosis not present

## 2019-10-06 DIAGNOSIS — M25571 Pain in right ankle and joints of right foot: Secondary | ICD-10-CM | POA: Diagnosis not present

## 2019-10-06 DIAGNOSIS — R262 Difficulty in walking, not elsewhere classified: Secondary | ICD-10-CM | POA: Diagnosis not present

## 2019-10-06 DIAGNOSIS — M25671 Stiffness of right ankle, not elsewhere classified: Secondary | ICD-10-CM | POA: Diagnosis not present

## 2019-10-06 DIAGNOSIS — G8929 Other chronic pain: Secondary | ICD-10-CM | POA: Diagnosis not present

## 2019-10-06 DIAGNOSIS — M19071 Primary osteoarthritis, right ankle and foot: Secondary | ICD-10-CM | POA: Diagnosis not present

## 2019-10-11 ENCOUNTER — Other Ambulatory Visit: Payer: Self-pay

## 2019-10-11 ENCOUNTER — Emergency Department (HOSPITAL_BASED_OUTPATIENT_CLINIC_OR_DEPARTMENT_OTHER)
Admission: EM | Admit: 2019-10-11 | Discharge: 2019-10-12 | Disposition: A | Discharge status: Home / Self Care | Payer: Medicare HMO | Attending: Emergency Medicine | Admitting: Emergency Medicine

## 2019-10-11 ENCOUNTER — Encounter (HOSPITAL_BASED_OUTPATIENT_CLINIC_OR_DEPARTMENT_OTHER): Payer: Self-pay | Admitting: *Deleted

## 2019-10-11 DIAGNOSIS — M19071 Primary osteoarthritis, right ankle and foot: Secondary | ICD-10-CM | POA: Diagnosis not present

## 2019-10-11 DIAGNOSIS — Q666 Other congenital valgus deformities of feet: Secondary | ICD-10-CM | POA: Diagnosis not present

## 2019-10-11 DIAGNOSIS — H538 Other visual disturbances: Secondary | ICD-10-CM | POA: Diagnosis present

## 2019-10-11 DIAGNOSIS — M25571 Pain in right ankle and joints of right foot: Secondary | ICD-10-CM | POA: Diagnosis not present

## 2019-10-11 MED ORDER — TETRACAINE HCL 0.5 % OP SOLN
OPHTHALMIC | Status: AC
  Filled 2019-10-11: qty 4

## 2019-10-11 MED ORDER — FLUORESCEIN SODIUM 1 MG OP STRP
ORAL_STRIP | OPHTHALMIC | Status: AC
  Filled 2019-10-11: qty 1

## 2019-10-11 NOTE — ED Triage Notes (Addendum)
Pt c/o sudden "blurred spot"right upper vision of right eye x 3 hrs , now slowing loosing vision in right eye with " darkness"

## 2019-10-11 NOTE — ED Notes (Signed)
Visual acuity with prescription glasses.

## 2019-10-12 ENCOUNTER — Other Ambulatory Visit (HOSPITAL_COMMUNITY)
Admission: RE | Admit: 2019-10-12 | Discharge: 2019-10-12 | Disposition: A | Discharge status: Home / Self Care | Payer: Medicare HMO | Source: Ambulatory Visit | Attending: Ophthalmology | Admitting: Ophthalmology

## 2019-10-12 DIAGNOSIS — H5310 Unspecified subjective visual disturbances: Secondary | ICD-10-CM | POA: Diagnosis not present

## 2019-10-12 DIAGNOSIS — G453 Amaurosis fugax: Secondary | ICD-10-CM | POA: Diagnosis not present

## 2019-10-12 LAB — C-REACTIVE PROTEIN: CRP: <0.8 mg/dL (ref <1.0)

## 2019-10-12 LAB — CBC WITH DIFFERENTIAL/PLATELET
Abs Immature Granulocytes: 0.05 10*3/uL (ref 0.00–0.07)
Basophils Absolute: 0.1 10*3/uL (ref 0.0–0.1)
Basophils Relative: 1 %
Eosinophils Absolute: 0.1 10*3/uL (ref 0.0–0.5)
Eosinophils Relative: 2 %
HCT: 48.1 % (ref 39.0–52.0)
Hemoglobin: 16 g/dL (ref 13.0–17.0)
Immature Granulocytes: 1 %
Lymphocytes Relative: 27 %
Lymphs Abs: 2.6 10*3/uL (ref 0.7–4.0)
MCH: 30 pg (ref 26.0–34.0)
MCHC: 33.3 g/dL (ref 30.0–36.0)
MCV: 90.2 fL (ref 80.0–100.0)
Monocytes Absolute: 0.8 10*3/uL (ref 0.1–1.0)
Monocytes Relative: 8 %
Neutro Abs: 5.8 10*3/uL (ref 1.7–7.7)
Neutrophils Relative %: 61 %
Platelets: 204 10*3/uL (ref 150–400)
RBC: 5.33 MIL/uL (ref 4.22–5.81)
RDW: 14.3 % (ref 11.5–15.5)
WBC: 9.4 10*3/uL (ref 4.0–10.5)
nRBC: 0 % (ref 0.0–0.2)

## 2019-10-12 LAB — SEDIMENTATION RATE: Sed Rate: 1 mm/hr (ref 0–16)

## 2019-10-12 NOTE — ED Provider Notes (Signed)
[facility] EMERGENCY DEPARTMENT Provider Note  CSN: [id number] Arrival date & time: 10/11/19 2257  Chief Complaint(s) Eye Problem  HPI George Blankenship is a 73 y.o. male who presents to the emergency department with gradually worsening right eye blurry vision that began approximately 3 to 4 hours prior to arrival.  Patient reports that it began as a block in the periphery that gradually expanded.  He is got blurry vision to right periphery of the right eye.  Vision in the left visual field of the right eye is at baseline vision in the left eye is at baseline.  He denies any pain to the eye.  No recent trauma.  He endorses recurrent sinus infections but no fevers.  No focal deficits, including speech aphasia or hemiparalysis.  HPI  Past Medical History Past Medical History:  Diagnosis Date  . Arthritis    Patient Active Problem List   Diagnosis Date Noted  . Pulmonary embolism ([place]) 11/30/2014  . Acute pulmonary embolism ([place]) 11/30/2014  . Hyperkalemia 11/30/2014   Home Medication(s) Prior to Admission medications   Medication Sig Start Date End Date Taking? Authorizing Provider  Omega-3 Fatty Acids (FISH OIL) 1000 MG CAPS Take 1,000 mg by mouth daily.    [provider]                                                                                                                                    Past Surgical History History reviewed. No pertinent surgical history. Family History No family history on file.  Social History Social History   Tobacco Use  . Smoking status: Never Smoker  . Smokeless tobacco: Never Used  . Tobacco comment: NEVER USED TOBACCO  Substance Use Topics  . Alcohol use: Yes    Alcohol/week: 0.0 standard drinks    Comment: social drinker  . Drug use: No   Allergies Statins and Lamisil [terbinafine]  Review of Systems Review of Systems All other systems are reviewed and are negative for acute change except as noted in the  HPI  Physical Exam Vital Signs  I have reviewed the triage vital signs BP 132/72   Pulse 76   Temp 97.6 F (36.4 C) (Oral)   Resp 18   Ht 6' (1.829 m)   Wt 83.9 kg   SpO2 98%   BMI 25.09 kg/m   Physical Exam Vitals signs reviewed.  Constitutional:      General: He is not in acute distress.    Appearance: He is well-developed. He is not diaphoretic.  HENT:     Head: Normocephalic and atraumatic.     Jaw: No trismus.     Right Ear: External ear normal.     Left Ear: External ear normal.     Nose: Nose normal.  Eyes:     General: No scleral icterus.    Conjunctiva/sclera: Conjunctivae normal.     Pupils: Pupils are  equal, round, and reactive to light.     Funduscopic exam:    Right eye: No hemorrhage, exudate or papilledema. Venous pulsations present.        Left eye: No hemorrhage, exudate or papilledema. Venous pulsations present.    Comments: OS: 20/50 OD: 20/70 With corrective glasses  Neck:     Musculoskeletal: Normal range of motion.     Trachea: Phonation normal.  Cardiovascular:     Rate and Rhythm: Normal rate and regular rhythm.  Pulmonary:     Effort: Pulmonary effort is normal. No respiratory distress.     Breath sounds: No stridor.  Abdominal:     General: There is no distension.  Musculoskeletal: Normal range of motion.  Neurological:     Mental Status: He is alert and oriented to person, place, and time.     Comments: Mental Status:  Alert and oriented to person, place, and time.  Attention and concentration normal.  Speech clear.  Recent memory is intact  Cranial Nerves:  II Visual Fields: Intact to confrontation. III, IV, VI: Pupils equal and reactive to light and near. Full eye movement without nystagmus  V Facial Sensation: Normal. No weakness of masticatory muscles  VII: No facial weakness or asymmetry  VIII Auditory Acuity: Grossly normal  IX/X: The uvula is midline; the palate elevates symmetrically  XI: Normal sternocleidomastoid and  trapezius strength  XII: The tongue is midline. No atrophy or fasciculations.   Motor System: Muscle Strength: 5/5 and symmetric in the upper and lower extremities. No pronation or drift.  Muscle Tone: Tone and muscle bulk are normal in the upper and lower extremities.   Reflexes: DTRs: 1+ and symmetrical in all four extremities. No Clonus Coordination: Intact finger-to-nose. No tremor.  Sensation: Intact to light touch  Gait: Routine gait normal.   Psychiatric:        Behavior: Behavior normal.     ED Results and Treatments Labs (all labs ordered are listed, but only abnormal results are displayed) Labs Reviewed - No data to display                                                                                                                       EKG  EKG Interpretation  Date/Time:    Ventricular Rate:    PR Interval:    QRS Duration:   QT Interval:    QTC Calculation:   R Axis:     Text Interpretation:        Radiology No results found.  Pertinent labs & imaging results that were available during my care of the patient were reviewed by me and considered in my medical decision making (see chart for details).  Medications Ordered in ED Medications  fluorescein 1 MG ophthalmic strip (has no administration in time range)  tetracaine (PONTOCAINE) 0.5 % ophthalmic solution (has no administration in time range)  Procedures Ultrasound ED Ocular  Date/Time: 10/12/2019 1:45 AM Performed by: George Blank, MD Authorized by: George Blank, MD   PROCEDURE DETAILS:    Indications: visual change     Assessed:  Left eye and right eye   Left eye axial view: obtained     Left eye saggital view: obtained     Right eye axial view: obtained     Right eye sagittal view: obtained     Images: archived   RIGHT EYE FINDINGS:     right  eye lens not dislodged    no retrobulbar hematoma in right eye    no evidence of retinal detachment of the right eye    no ruptured globe in right eye    no vitreous hemorrhage in right eye LEFT EYE FINDINGS:     left eye lens not dislodged    no retrobulbar hematoma in left eye    no evidence of retinal detachment of the left eye    no ruptured globe in left eye    no vitreous hemorrhage in left eye    (including critical care time)  Medical Decision Making / ED Course I have reviewed the nursing notes for this encounter and the patient's prior records (if available in EHR or on provided paperwork).   George Blankenship was evaluated in Emergency Department on 10/12/2019 for the symptoms described in the history of present illness. He was evaluated in the context of the global COVID-19 pandemic, which necessitated consideration that the patient might be at risk for infection with the SARS-CoV-2 virus that causes COVID-19. Institutional protocols and algorithms that pertain to the evaluation of patients at risk for COVID-19 are in a state of rapid change based on information released by regulatory bodies including the CDC and federal and state organizations. These policies and algorithms were followed during the patient's care in the ED.  Patient presents with right-sided blurry vision.  No hemianopsia.  No focal deficits concerning for CVA.  No pain concerning for glaucoma.  Presentation not suspicious for arterial venous occlusion.  Most suspicious for retinal detachment versus posterior vitreous detachment. Unable to visualize a detachment with ultrasound but symptoms do appear to be consistent.  Discussed case with Dr. Valetta Close from ophthalmology who will evaluate the patient in the morning.  The patient appears reasonably screened and/or stabilized for discharge and I doubt any other medical condition or other [facility] requiring further screening, evaluation, or treatment in the ED at this  time prior to discharge.  The patient is safe for discharge with strict return precautions.       Final Clinical Impression(s) / ED Diagnoses Final diagnoses:  Blurred vision, right eye    The patient appears reasonably screened and/or stabilized for discharge and I doubt any other medical condition or other [facility] requiring further screening, evaluation, or treatment in the ED at this time prior to discharge.  Disposition: Discharge  Condition: Good  I have discussed the results, Dx and Tx plan with the patient who expressed understanding and agree(s) with the plan. Discharge instructions discussed at great length. The patient was given strict return precautions who verbalized understanding of the instructions. No further questions at time of discharge.    ED Discharge Orders    None        Follow Up: Jola Schmidt, MD [address] [place] [id number] [phone]  Go to  at 0815 am for close follow up      This chart  was dictated using voice recognition software.  Despite best efforts to proofread,  errors can occur which can change the documentation meaning.   George Blank, MD 10/12/19 [phone]

## 2019-10-12 NOTE — ED Notes (Signed)
Contacted [facility] for consult @ [phone]

## 2019-10-13 ENCOUNTER — Other Ambulatory Visit (HOSPITAL_COMMUNITY): Payer: Self-pay | Admitting: Ophthalmology

## 2019-10-13 DIAGNOSIS — G453 Amaurosis fugax: Secondary | ICD-10-CM

## 2019-10-13 DIAGNOSIS — R262 Difficulty in walking, not elsewhere classified: Secondary | ICD-10-CM | POA: Diagnosis not present

## 2019-10-13 DIAGNOSIS — M19071 Primary osteoarthritis, right ankle and foot: Secondary | ICD-10-CM | POA: Diagnosis not present

## 2019-10-13 DIAGNOSIS — G8929 Other chronic pain: Secondary | ICD-10-CM | POA: Diagnosis not present

## 2019-10-13 DIAGNOSIS — R29898 Other symptoms and signs involving the musculoskeletal system: Secondary | ICD-10-CM | POA: Diagnosis not present

## 2019-10-13 DIAGNOSIS — M25571 Pain in right ankle and joints of right foot: Secondary | ICD-10-CM | POA: Diagnosis not present

## 2019-10-13 DIAGNOSIS — M25671 Stiffness of right ankle, not elsewhere classified: Secondary | ICD-10-CM | POA: Diagnosis not present

## 2019-10-19 ENCOUNTER — Emergency Department (HOSPITAL_COMMUNITY): Payer: Medicare HMO

## 2019-10-19 ENCOUNTER — Emergency Department (HOSPITAL_COMMUNITY)
Admission: EM | Admit: 2019-10-19 | Discharge: 2019-10-19 | Disposition: A | Discharge status: Home / Self Care | Payer: Medicare HMO | Attending: Emergency Medicine | Admitting: Emergency Medicine

## 2019-10-19 ENCOUNTER — Other Ambulatory Visit: Payer: Self-pay

## 2019-10-19 DIAGNOSIS — H4710 Unspecified papilledema: Secondary | ICD-10-CM

## 2019-10-19 DIAGNOSIS — H538 Other visual disturbances: Secondary | ICD-10-CM | POA: Diagnosis not present

## 2019-10-19 DIAGNOSIS — H53451 Other localized visual field defect, right eye: Secondary | ICD-10-CM | POA: Diagnosis not present

## 2019-10-19 DIAGNOSIS — H5461 Unqualified visual loss, right eye, normal vision left eye: Secondary | ICD-10-CM | POA: Diagnosis not present

## 2019-10-19 DIAGNOSIS — H539 Unspecified visual disturbance: Secondary | ICD-10-CM | POA: Diagnosis present

## 2019-10-19 DIAGNOSIS — H5310 Unspecified subjective visual disturbances: Secondary | ICD-10-CM | POA: Diagnosis not present

## 2019-10-19 DIAGNOSIS — Z86711 Personal history of pulmonary embolism: Secondary | ICD-10-CM | POA: Insufficient documentation

## 2019-10-19 LAB — CBC WITH DIFFERENTIAL/PLATELET
Abs Immature Granulocytes: 0.05 10*3/uL (ref 0.00–0.07)
Basophils Absolute: 0.1 10*3/uL (ref 0.0–0.1)
Basophils Relative: 1 %
Eosinophils Absolute: 0.2 10*3/uL (ref 0.0–0.5)
Eosinophils Relative: 2 %
HCT: 48.4 % (ref 39.0–52.0)
Hemoglobin: 15.8 g/dL (ref 13.0–17.0)
Immature Granulocytes: 1 %
Lymphocytes Relative: 28 %
Lymphs Abs: 3.1 10*3/uL (ref 0.7–4.0)
MCH: 29.4 pg (ref 26.0–34.0)
MCHC: 32.6 g/dL (ref 30.0–36.0)
MCV: 90.1 fL (ref 80.0–100.0)
Monocytes Absolute: 1 10*3/uL (ref 0.1–1.0)
Monocytes Relative: 9 %
Neutro Abs: 6.6 10*3/uL (ref 1.7–7.7)
Neutrophils Relative %: 59 %
Platelets: 195 10*3/uL (ref 150–400)
RBC: 5.37 MIL/uL (ref 4.22–5.81)
RDW: 14 % (ref 11.5–15.5)
WBC: 10.9 10*3/uL — ABNORMAL HIGH (ref 4.0–10.5)
nRBC: 0 % (ref 0.0–0.2)

## 2019-10-19 LAB — BASIC METABOLIC PANEL
Anion gap: 10 (ref 5–15)
BUN: 17 mg/dL (ref 8–23)
CO2: 26 mmol/L (ref 22–32)
Calcium: 9.4 mg/dL (ref 8.9–10.3)
Chloride: 105 mmol/L (ref 98–111)
Creatinine, Ser: 1.36 mg/dL — ABNORMAL HIGH (ref 0.61–1.24)
GFR calc Af Amer: 60 mL/min — ABNORMAL LOW (ref >60)
GFR calc non Af Amer: 52 mL/min — ABNORMAL LOW (ref >60)
Glucose, Bld: 123 mg/dL — ABNORMAL HIGH (ref 70–99)
Potassium: 4.5 mmol/L (ref 3.5–5.1)
Sodium: 141 mmol/L (ref 135–145)

## 2019-10-19 MED ORDER — GADOBUTROL 1 MMOL/ML IV SOLN
8.0000 mL | Freq: Once | INTRAVENOUS | Status: AC | PRN
Start: 2019-10-19 — End: 2019-10-19
  Administered 2019-10-19: 8 mL via INTRAVENOUS

## 2019-10-19 NOTE — ED Notes (Signed)
Patient transported to MRI 

## 2019-10-19 NOTE — ED Provider Notes (Signed)
   Received call from Dr. Valetta Close (optho).  He evaluated patient today for vision changes.  On his exam, he noticed optic edema and concern for retinal hemorrhage.  He is sending patient over in fields patient will need an MRI brain and MRI orbit.  Portions of this note were generated with [employer]. Dictation errors may occur despite best attempts at proofreading.    Volanda Napoleon, PA-C 10/19/19 1517    Veryl Speak, MD 10/19/19 [phone]

## 2019-10-19 NOTE — ED Provider Notes (Signed)
[facility] EMERGENCY DEPARTMENT Provider Note   CSN: [id number] Arrival date & time: 10/19/19  1433     History   Chief Complaint Chief Complaint  Patient presents with  . Loss of Vision    HPI George Blankenship is a 73 y.o. male hx of arthritis, PE plan off anticoagulation here presenting with blurry vision.  Patient has been having blurry vision of the right eye for about a week or so.  Patient went to the ER and had a ocular ultrasound that showed a possible retinal detachment or vitreous hemorrhage. Patient saw ophthalmology twice now.  Today while in the office, he was noted to have optic disc edema versus retinal hemorrhage.  Patient is sent in for MRI of the orbits and MRI of the brain.  Patient does not have any trouble speaking or weakness or numbness.     The history is provided by the patient.    Past Medical History:  Diagnosis Date  . Arthritis     Patient Active Problem List   Diagnosis Date Noted  . Pulmonary embolism ([place]) 11/30/2014  . Acute pulmonary embolism ([place]) 11/30/2014  . Hyperkalemia 11/30/2014    No past surgical history on file.      Home Medications    Prior to Admission medications   Medication Sig Start Date End Date Taking? Authorizing Provider  Omega-3 Fatty Acids (FISH OIL) 1000 MG CAPS Take 1,000 mg by mouth daily.    [provider]    Family History No family history on file.  Social History Social History   Tobacco Use  . Smoking status: Never Smoker  . Smokeless tobacco: Never Used  . Tobacco comment: NEVER USED TOBACCO  Substance Use Topics  . Alcohol use: Yes    Alcohol/week: 0.0 standard drinks    Comment: social drinker  . Drug use: No     Allergies   Statins and Lamisil [terbinafine]   Review of Systems Review of Systems  Eyes: Positive for visual disturbance.  All other systems reviewed and are negative.    Physical Exam Updated Vital Signs BP (!) 136/50   Pulse (!) 43    Temp 97.8 F (36.6 C) (Oral)   Resp 13   SpO2 99%   Physical Exam Vitals signs and nursing note reviewed.  HENT:     Head: Normocephalic.     Nose: Nose normal.     Mouth/Throat:     Mouth: Mucous membranes are moist.  Eyes:     Extraocular Movements: Extraocular movements intact.     Pupils: Pupils are equal, round, and reactive to light.     Comments: R optic nerve disc edema   Neck:     Musculoskeletal: Normal range of motion.  Cardiovascular:     Rate and Rhythm: Normal rate and regular rhythm.     Pulses: Normal pulses.     Heart sounds: Normal heart sounds.  Pulmonary:     Effort: Pulmonary effort is normal.     Breath sounds: Normal breath sounds.  Abdominal:     General: Abdomen is flat.     Palpations: Abdomen is soft.  Musculoskeletal: Normal range of motion.  Skin:    General: Skin is warm.     Capillary Refill: Capillary refill takes less than 2 seconds.  Neurological:     General: No focal deficit present.     Mental Status: He is alert and oriented to person, place, and time.  Comments: CN 2- 12 intact, nl strength throughout, nl finger to nose bilaterally. Nl gait   Psychiatric:        Mood and Affect: Mood normal.        Behavior: Behavior normal.      ED Treatments / Results  Labs (all labs ordered are listed, but only abnormal results are displayed) Labs Reviewed  BASIC METABOLIC PANEL - Abnormal; Notable for the following components:      Result Value   Glucose, Bld 123 (*)    Creatinine, Ser 1.36 (*)    GFR calc non Af Amer 52 (*)    GFR calc Af Amer 60 (*)    All other components within normal limits  CBC WITH DIFFERENTIAL/PLATELET - Abnormal; Notable for the following components:   WBC 10.9 (*)    All other components within normal limits    EKG None  Radiology Mr Brain Wo Contrast  Result Date: 10/19/2019 CLINICAL DATA:  73 year old male with decreased peripheral vision in the right eye for 5 days. Optic nerve edema and  possible retinal hemorrhage on exam by ophthalmology today. EXAM: MRI HEAD WITHOUT CONTRAST MRI ORBITS  WITHOUT AND WITH CONTRAST TECHNIQUE: Multiplanar, multiecho pulse sequences of the brain and surrounding structures were obtained without and with intravenous contrast. Multiplanar, multiecho pulse sequences of the orbits and surrounding structures were obtained including fat saturation techniques, before and after intravenous contrast administration. CONTRAST:  19mL GADAVIST GADOBUTROL 1 MMOL/ML IV SOLN COMPARISON:  None. FINDINGS: MRI HEAD FINDINGS Brain: No restricted diffusion to suggest acute infarction. No midline shift, mass effect, evidence of mass lesion, ventriculomegaly, extra-axial collection or acute intracranial hemorrhage. Cervicomedullary junction and pituitary are within normal limits. Cerebral volume loss appears generalized, no disproportionate areas of brain atrophy identified. No cortical encephalomalacia or chronic cerebral blood products identified. Mild for age scattered nonspecific cerebral white matter T2 and FLAIR hyperintensity. Deep gray matter nuclei and brainstem appear normal. 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 Vascular: Major intracranial vascular flow voids are preserved. Tortuous distal right vertebral artery. Skull and upper cervical spine: Negative visible cervical spine. Normal bone marrow signal. Other: Mastoids are clear. Visible internal auditory structures appear normal. Scalp soft tissues appear negative. MRI ORBITS FINDINGS Orbits: Normal suprasellar cistern. Normal optic chiasm. The cavernous sinus appears normal. Symmetric appearance of the pre chiasmatic optic nerves. No abnormal enhancement of the optic nerves. No signal abnormality within the substance of either optic nerve. No intraorbital mass or inflammation. Extraocular muscles and lacrimal glands appear symmetric and normal. The globes appear symmetric and  normal with no retinal thickening or abnormal enhancement identified. Visualized sinuses: Clear aside from trace right maxillary alveolar recess mucosal thickening. Soft tissues: Superficial periorbital soft tissues appear normal. Negative visible deep soft tissue spaces of the face. Limited intracranial: Asymmetric tortuosity of the left ICA terminus appears related to dominant left and diminutive or absent right ACA A1 segments. No abnormal enhancement of the visible brain parenchyma or dura. IMPRESSION: 1. Symmetric and normal MRI appearance of the orbits. 2. No acute intracranial abnormality. Mild for age cerebral volume loss and chronic small vessel disease. Electronically Signed   By: Genevie Ann M.D.   On: 10/19/2019 19:15   Mr Rosealee Albee [id number] Contrast  Result Date: 10/19/2019 CLINICAL DATA:  73 year old male with decreased peripheral vision in the right eye for 5 days. Optic nerve edema and possible retinal hemorrhage on exam by ophthalmology today. EXAM:  MRI HEAD WITHOUT CONTRAST MRI ORBITS  WITHOUT AND WITH CONTRAST TECHNIQUE: Multiplanar, multiecho pulse sequences of the brain and surrounding structures were obtained without and with intravenous contrast. Multiplanar, multiecho pulse sequences of the orbits and surrounding structures were obtained including fat saturation techniques, before and after intravenous contrast administration. CONTRAST:  25mL GADAVIST GADOBUTROL 1 MMOL/ML IV SOLN COMPARISON:  None. FINDINGS: MRI HEAD FINDINGS Brain: No restricted diffusion to suggest acute infarction. No midline shift, mass effect, evidence of mass lesion, ventriculomegaly, extra-axial collection or acute intracranial hemorrhage. Cervicomedullary junction and pituitary are within normal limits. Cerebral volume loss appears generalized, no disproportionate areas of brain atrophy identified. No cortical encephalomalacia or chronic cerebral blood products identified. Mild for age scattered nonspecific cerebral white  matter T2 and FLAIR hyperintensity. Deep gray matter nuclei and brainstem appear normal. 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 Vascular: Major intracranial vascular flow voids are preserved. Tortuous distal right vertebral artery. Skull and upper cervical spine: Negative visible cervical spine. Normal bone marrow signal. Other: Mastoids are clear. Visible internal auditory structures appear normal. Scalp soft tissues appear negative. MRI ORBITS FINDINGS Orbits: Normal suprasellar cistern. Normal optic chiasm. The cavernous sinus appears normal. Symmetric appearance of the pre chiasmatic optic nerves. No abnormal enhancement of the optic nerves. No signal abnormality within the substance of either optic nerve. No intraorbital mass or inflammation. Extraocular muscles and lacrimal glands appear symmetric and normal. The globes appear symmetric and normal with no retinal thickening or abnormal enhancement identified. Visualized sinuses: Clear aside from trace right maxillary alveolar recess mucosal thickening. Soft tissues: Superficial periorbital soft tissues appear normal. Negative visible deep soft tissue spaces of the face. Limited intracranial: Asymmetric tortuosity of the left ICA terminus appears related to dominant left and diminutive or absent right ACA A1 segments. No abnormal enhancement of the visible brain parenchyma or dura. IMPRESSION: 1. Symmetric and normal MRI appearance of the orbits. 2. No acute intracranial abnormality. Mild for age cerebral volume loss and chronic small vessel disease. Electronically Signed   By: Genevie Ann M.D.   On: 10/19/2019 19:15    Procedures Procedures (including critical care time)  Medications Ordered in ED Medications  gadobutrol (GADAVIST) 1 MMOL/ML injection 8 mL (8 mLs Intravenous Contrast Given 10/19/19 1848)     Initial Impression / Assessment and Plan / ED Course  I have reviewed the triage vital signs and  the nursing notes.  Pertinent labs & imaging results that were available during my care of the patient were reviewed by me and considered in my medical decision making (see chart for details).       AXYL NIEMEIER is a 73 y.o. male here with R optic nerve edema vs retinal hemorrhage. Will get MRI brain and orbits and labs. No neuro deficits currently.  8:24 PM  MRI of brain and orbits were unremarkable. I talked to Dr. Ellie Lunch from ophtho. She states that patient can be discharged and follow up outpatient since MRI was unremarkable. Told him to call Dr. Romeo Apple office in the morning    Final Clinical Impressions(s) / ED Diagnoses   Final diagnoses:  None    ED Discharge Orders    None       Drenda Freeze, MD 10/19/19 2025

## 2019-10-19 NOTE — ED Triage Notes (Signed)
Pt here sent here for MRI for evaluation of potential of swelling on optic nerve. Decreased peripheral vision in R eye x 5 days.

## 2019-10-19 NOTE — Discharge Instructions (Signed)
Your MRI of your eye and brain were unremarkable   Call Dr. Romeo Apple office tomorrow   Return to ER if you have worse blurry vision, loss of vision, weakness, numbness, trouble speaking

## 2019-10-19 NOTE — ED Notes (Signed)
Patient verbalizes understanding of discharge instructions. Opportunity for questioning and answers were provided. Armband removed by staff, pt discharged from ED. Pt. ambulatory and discharged home.  

## 2019-10-20 ENCOUNTER — Encounter (HOSPITAL_COMMUNITY): Payer: Medicare HMO

## 2019-10-21 DIAGNOSIS — H47011 Ischemic optic neuropathy, right eye: Secondary | ICD-10-CM | POA: Diagnosis not present

## 2019-10-21 DIAGNOSIS — H40233 Intermittent angle-closure glaucoma, bilateral: Secondary | ICD-10-CM | POA: Diagnosis not present

## 2019-10-21 DIAGNOSIS — H5310 Unspecified subjective visual disturbances: Secondary | ICD-10-CM | POA: Diagnosis not present

## 2019-10-26 ENCOUNTER — Other Ambulatory Visit (HOSPITAL_COMMUNITY): Payer: Medicare HMO

## 2019-10-27 ENCOUNTER — Ambulatory Visit (INDEPENDENT_AMBULATORY_CARE_PROVIDER_SITE_OTHER): Payer: Medicare HMO

## 2019-10-27 ENCOUNTER — Ambulatory Visit: Payer: Medicare HMO | Admitting: Podiatry

## 2019-10-27 ENCOUNTER — Other Ambulatory Visit: Payer: Self-pay

## 2019-10-27 DIAGNOSIS — M25571 Pain in right ankle and joints of right foot: Secondary | ICD-10-CM | POA: Diagnosis not present

## 2019-10-27 DIAGNOSIS — M19071 Primary osteoarthritis, right ankle and foot: Secondary | ICD-10-CM | POA: Diagnosis not present

## 2019-10-27 DIAGNOSIS — M79671 Pain in right foot: Secondary | ICD-10-CM

## 2019-10-27 NOTE — Patient Instructions (Signed)
Pre-Operative Instructions  Congratulations, you have decided to take an important step towards improving your quality of life.  You can be assured that the doctors and staff at [facility] will be with you every step of the way.  Here are some important things you should know:  1. Plan to be at the surgery center/hospital at least 1 (one) hour prior to your scheduled time, unless otherwise directed by the surgical center/hospital staff.  You must have a responsible adult accompany you, remain during the surgery and drive you home.  Make sure you have directions to the surgical center/hospital to ensure you arrive on time. 2. If you are having surgery at [facility] or [facility], you will need a copy of your medical history and physical form from your family physician within one month prior to the date of surgery. We will give you a form for your primary physician to complete.  3. We make every effort to accommodate the date you request for surgery.  However, there are times where surgery dates or times have to be moved.  We will contact you as soon as possible if a change in schedule is required.   4. No aspirin/ibuprofen for one week before surgery.  If you are on aspirin, any non-steroidal anti-inflammatory medications (Mobic, Aleve, Ibuprofen) should not be taken seven (7) days prior to your surgery.  You make take Tylenol for pain prior to surgery.  5. Medications - If you are taking daily heart and blood pressure medications, seizure, reflux, allergy, asthma, anxiety, pain or diabetes medications, make sure you notify the surgery center/hospital before the day of surgery so they can tell you which medications you should take or avoid the day of surgery. 6. No food or drink after midnight the night before surgery unless directed otherwise by surgical center/hospital staff. 7. No alcoholic beverages 24-hours prior to surgery.  No smoking 24-hours prior or 24-hours after  surgery. 8. Wear loose pants or shorts. They should be loose enough to fit over bandages, boots, and casts. 9. Don't wear slip-on shoes. Sneakers are preferred. 10. Bring your boot with you to the surgery center/hospital.  Also bring crutches or a walker if your physician has prescribed it for you.  If you do not have this equipment, it will be provided for you after surgery. 11. If you have not been contacted by the surgery center/hospital by the day before your surgery, call to confirm the date and time of your surgery. 12. Leave-time from work may vary depending on the type of surgery you have.  Appropriate arrangements should be made prior to surgery with your employer. 13. Prescriptions will be provided immediately following surgery by your doctor.  Fill these as soon as possible after surgery and take the medication as directed. Pain medications will not be refilled on weekends and must be approved by the doctor. 14. Remove nail polish on the operative foot and avoid getting pedicures prior to surgery. 15. Wash the night before surgery.  The night before surgery wash the foot and leg well with water and the antibacterial soap provided. Be sure to pay special attention to beneath the toenails and in between the toes.  Wash for at least three (3) minutes. Rinse thoroughly with water and dry well with a towel.  Perform this wash unless told not to do so by your physician.  Enclosed: 1 Ice pack (please put in freezer the night before surgery)   1 Hibiclens skin cleaner     Pre-op instructions  If you have any questions regarding the instructions, please do not hesitate to call our office.  [place]: [address], [place], [place] [address] -- [phone]  [place]: [address] [place], [place] [address] -- [phone]  [place]: [address]  [place], [place] [address] -- [phone]   Website: [email or web address] 

## 2019-10-28 DIAGNOSIS — H47011 Ischemic optic neuropathy, right eye: Secondary | ICD-10-CM | POA: Diagnosis not present

## 2019-11-01 ENCOUNTER — Other Ambulatory Visit: Payer: Self-pay | Admitting: Podiatry

## 2019-11-01 DIAGNOSIS — M19071 Primary osteoarthritis, right ankle and foot: Secondary | ICD-10-CM

## 2019-11-01 NOTE — Progress Notes (Signed)
   HPI: 73 y.o. male presenting today as a new patient for evaluation of right foot and ankle pain is been going on for approximately 3 years now.  Patient has a history of calcaneal fracture to the right foot and is slowly, gradually developed swelling with pain during ambulation after surgery.  He has been seen by different physicians at [facility] who recommended tibiotalar and subtalar joint fusions.  He was uncomfortable proceeding with surgery and would like a second opinion.  He has received multiple conservative treatment modalities including cortisone shots and other conservative treatments for the past 3 years.  Past Medical History:  Diagnosis Date  . Arthritis      Physical Exam: General: The patient is alert and oriented x3 in no acute distress.  Dermatology: Skin is warm, dry and supple bilateral lower extremities. Negative for open lesions or macerations.  Vascular: Palpable pedal pulses bilaterally. No edema or erythema noted. Capillary refill within normal limits.  Neurological: Epicritic and protective threshold grossly intact bilaterally.   Musculoskeletal Exam: Range of motion within normal limits to all pedal and ankle joints bilateral. Muscle strength 5/5 in all groups bilateral.  Negative for any significant pain on palpation with range of motion to the ankle joint in dorsiflexion plantarflexion of the foot.  There is some limited range of motion however no pain with range of motion to the ankle joint specifically.  There is pain with inversion and eversion of the subtalar joint and also with palpation.  Limited range of motion noted as well  Radiographic Exam:  Normal osseous mineralization. Joint spaces preserved with exception of the subtalar joint that demonstrates degenerative changes and joint space narrowing. No fracture/dislocation/boney destruction.    Assessment: 1.  DJD subtalar joint right foot 2.  History of calcaneal fracture right foot   Plan of Care:   1. Patient evaluated. X-Rays reviewed.  2.  Today I recommend an isolated subtalar joint arthrodesis to the right foot.  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  He is currently asymptomatic with his right ankle joint.  I do not recommend arthrodesis of the tibiotalar joint at the moment. 3.  I discussed in detail with the patient and the surgery.  He would like to proceed with surgery here today.  All possible complications and details the procedure were explained.  No guarantees were expressed or implied.  All patient questions were answered. 4.  Authorization for surgery was initiated today.  Surgery will consist of subtalar joint arthrodesis right foot 5.  Return to clinic 1 week postop      Edrick Kins, DPM [facility]  Dr. Edrick Kins, DPM    [address] [place] [address], [place] [address]                Office [phone]  Fax [phone]

## 2019-12-01 ENCOUNTER — Telehealth: Payer: Self-pay | Admitting: *Deleted

## 2019-12-01 NOTE — Telephone Encounter (Signed)
"  I am calling to find out if I'm scheduled for surgery."  Yes, we have you scheduled for December 23, 2019.  "Okay, I want to cancel that."  Is there a reason for the cancellation?  "I don't like [facility].  I've had some bad experiences.  I'm going to go to a [facility] doctor."  I'll let Dr. Amalia Hailey know and I'll call the surgery center scheduler and cancel your surgery.  "Will you send me an email letting me know that it's been canceled?"  Do you have MyChart?  "Yes, I do."  I'll send you a message in MyChart.  "My surgery wasn't showing up in [facility]."  The surgical center is not a part of [facility]'s Epic system.  I called and asked Renee, office manager at [facility], to cancel his surgery for 12/23/2019.

## 2019-12-02 DIAGNOSIS — H47011 Ischemic optic neuropathy, right eye: Secondary | ICD-10-CM | POA: Diagnosis not present

## 2019-12-24 DIAGNOSIS — X32XXXD Exposure to sunlight, subsequent encounter: Secondary | ICD-10-CM | POA: Diagnosis not present

## 2019-12-24 DIAGNOSIS — D225 Melanocytic nevi of trunk: Secondary | ICD-10-CM | POA: Diagnosis not present

## 2019-12-24 DIAGNOSIS — Z08 Encounter for follow-up examination after completed treatment for malignant neoplasm: Secondary | ICD-10-CM | POA: Diagnosis not present

## 2019-12-24 DIAGNOSIS — Z1283 Encounter for screening for malignant neoplasm of skin: Secondary | ICD-10-CM | POA: Diagnosis not present

## 2019-12-24 DIAGNOSIS — L570 Actinic keratosis: Secondary | ICD-10-CM | POA: Diagnosis not present

## 2019-12-24 DIAGNOSIS — Z85820 Personal history of malignant melanoma of skin: Secondary | ICD-10-CM | POA: Diagnosis not present

## 2019-12-27 DIAGNOSIS — M25471 Effusion, right ankle: Secondary | ICD-10-CM | POA: Diagnosis not present

## 2019-12-27 DIAGNOSIS — M19071 Primary osteoarthritis, right ankle and foot: Secondary | ICD-10-CM | POA: Diagnosis not present

## 2019-12-27 DIAGNOSIS — M25571 Pain in right ankle and joints of right foot: Secondary | ICD-10-CM | POA: Diagnosis not present

## 2019-12-27 DIAGNOSIS — M778 Other enthesopathies, not elsewhere classified: Secondary | ICD-10-CM | POA: Diagnosis not present

## 2020-01-14 DIAGNOSIS — Z01812 Encounter for preprocedural laboratory examination: Secondary | ICD-10-CM | POA: Diagnosis not present

## 2020-01-14 DIAGNOSIS — M19071 Primary osteoarthritis, right ankle and foot: Secondary | ICD-10-CM | POA: Diagnosis not present

## 2020-01-14 DIAGNOSIS — Z20822 Contact with and (suspected) exposure to covid-19: Secondary | ICD-10-CM | POA: Diagnosis not present

## 2020-01-21 DIAGNOSIS — Z981 Arthrodesis status: Secondary | ICD-10-CM | POA: Diagnosis not present

## 2020-01-21 DIAGNOSIS — G8918 Other acute postprocedural pain: Secondary | ICD-10-CM | POA: Diagnosis not present

## 2020-01-21 DIAGNOSIS — M25571 Pain in right ankle and joints of right foot: Secondary | ICD-10-CM | POA: Diagnosis not present

## 2020-01-21 DIAGNOSIS — M19071 Primary osteoarthritis, right ankle and foot: Secondary | ICD-10-CM | POA: Diagnosis not present

## 2020-01-21 DIAGNOSIS — Z7982 Long term (current) use of aspirin: Secondary | ICD-10-CM | POA: Diagnosis not present

## 2020-01-21 DIAGNOSIS — Z86718 Personal history of other venous thrombosis and embolism: Secondary | ICD-10-CM | POA: Diagnosis not present

## 2020-01-21 DIAGNOSIS — G8928 Other chronic postprocedural pain: Secondary | ICD-10-CM | POA: Diagnosis not present

## 2020-02-09 DIAGNOSIS — Z4789 Encounter for other orthopedic aftercare: Secondary | ICD-10-CM | POA: Diagnosis not present

## 2020-02-09 DIAGNOSIS — M19071 Primary osteoarthritis, right ankle and foot: Secondary | ICD-10-CM | POA: Diagnosis not present

## 2020-02-09 DIAGNOSIS — Z981 Arthrodesis status: Secondary | ICD-10-CM | POA: Diagnosis not present

## 2020-02-23 DIAGNOSIS — H5712 Ocular pain, left eye: Secondary | ICD-10-CM | POA: Diagnosis not present

## 2020-02-23 DIAGNOSIS — H1032 Unspecified acute conjunctivitis, left eye: Secondary | ICD-10-CM | POA: Diagnosis not present

## 2020-02-24 DIAGNOSIS — H2000 Unspecified acute and subacute iridocyclitis: Secondary | ICD-10-CM | POA: Diagnosis not present

## 2020-02-28 DIAGNOSIS — H2000 Unspecified acute and subacute iridocyclitis: Secondary | ICD-10-CM | POA: Diagnosis not present

## 2020-03-02 DIAGNOSIS — M19071 Primary osteoarthritis, right ankle and foot: Secondary | ICD-10-CM | POA: Diagnosis not present

## 2020-03-02 DIAGNOSIS — M85871 Other specified disorders of bone density and structure, right ankle and foot: Secondary | ICD-10-CM | POA: Diagnosis not present

## 2020-03-02 DIAGNOSIS — Z981 Arthrodesis status: Secondary | ICD-10-CM | POA: Diagnosis not present

## 2020-03-02 DIAGNOSIS — Z4789 Encounter for other orthopedic aftercare: Secondary | ICD-10-CM | POA: Diagnosis not present

## 2020-03-02 DIAGNOSIS — Q688 Other specified congenital musculoskeletal deformities: Secondary | ICD-10-CM | POA: Diagnosis not present

## 2020-03-16 DIAGNOSIS — H2000 Unspecified acute and subacute iridocyclitis: Secondary | ICD-10-CM | POA: Diagnosis not present

## 2020-04-18 IMAGING — MR MR ORBITS WO/W CM
9 of 15 series · 25 of 48 positions shown · IV contrast (YES GV)
Comparison: None.

CLINICAL DATA: 73-year-old male with decreased peripheral vision in
the right eye for 5 days. Optic nerve edema and possible retinal
hemorrhage on exam by ophthalmology today.

EXAM:
MRI HEAD WITHOUT CONTRAST
MRI ORBITS  WITHOUT AND WITH CONTRAST
TECHNIQUE: Multiplanar, multiecho pulse sequences of the brain and surrounding
structures were obtained without and with intravenous contrast.
Multiplanar, multiecho pulse sequences of the orbits and surrounding
structures were obtained including fat saturation techniques, before
and after intravenous contrast administration.
CONTRAST:  8mL GADAVIST GADOBUTROL 1 MMOL/ML IV SOLN

[Series 2: FLAIR · sagittal · 5.0mm · 0.47mm/px · 1 of 23 slices shown (1 of 2)]
[im 1/23]
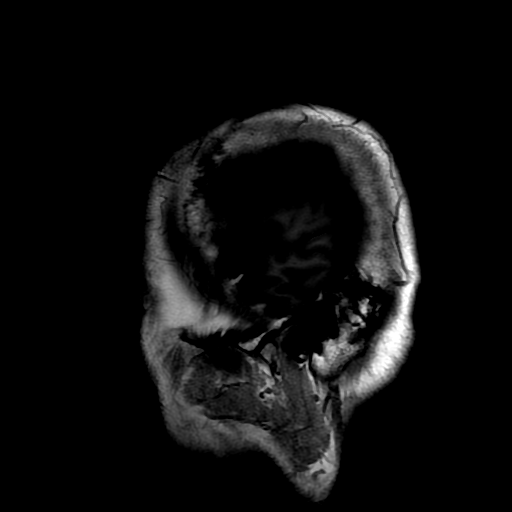

[Series 3: DWI · axial · 3.0mm · 0.94mm/px · z∈[-109,+70]mm · 7 of 122 slices shown]
[im 1/122]
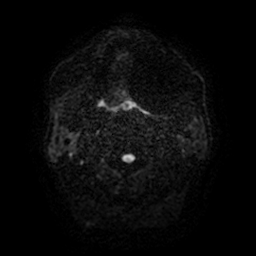
[im 21/122]
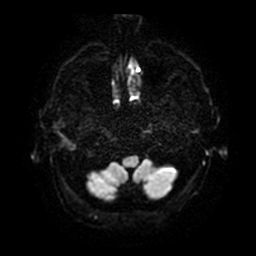
[im 41/122]
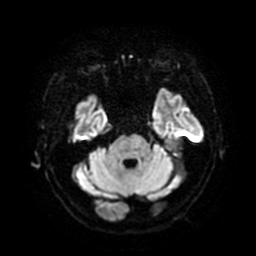
[im 61/122]
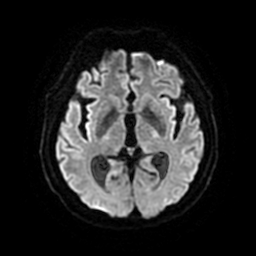
[im 81/122]
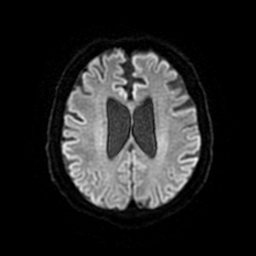
[im 101/122]
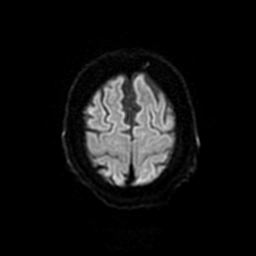
[im 122/122]
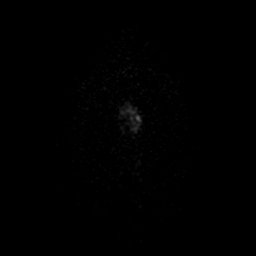

[Series 4: T2 · axial · 5.0mm · 0.47mm/px · 1 of 29 slices shown]
[im 1/29]
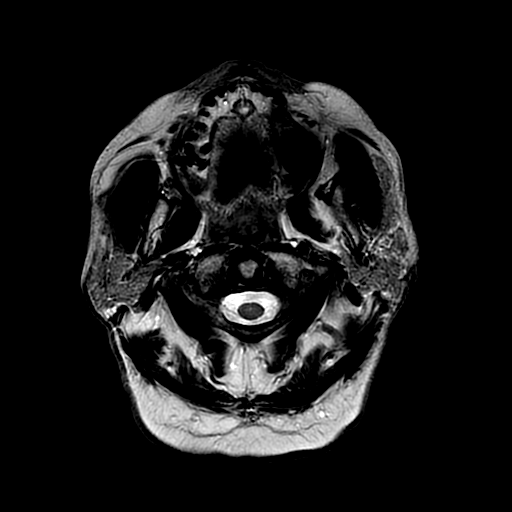

[Series 5: FLAIR · axial · 3.0mm · 0.47mm/px · 1 of 29 slices shown (2 of 2)]
[im 1/29]
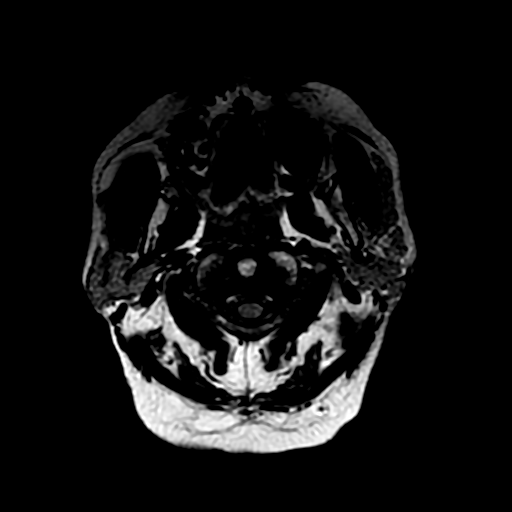

[Series 6: SWI · axial · 3.0mm · 0.47mm/px · z∈[-100,-2]mm · 5 of 116 slices shown]
[im 1/116]
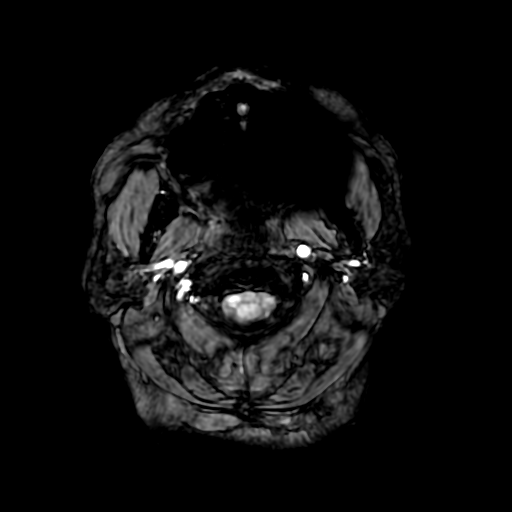
[im 17/116]
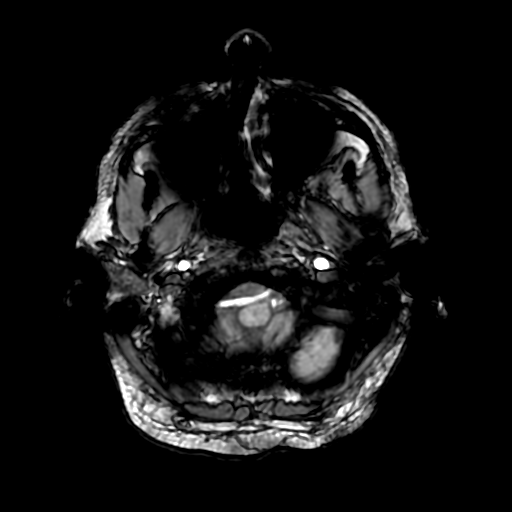
[im 33/116]
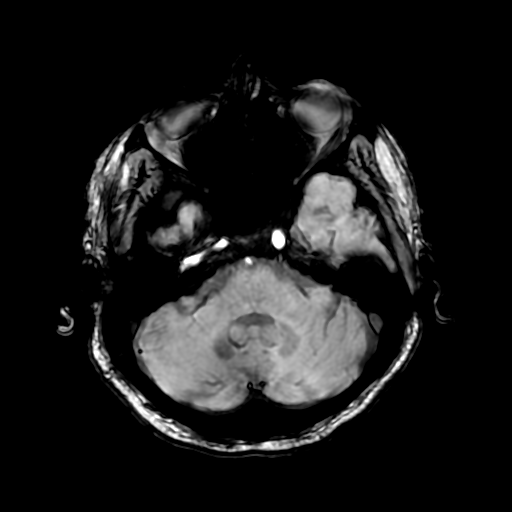
[im 50/116]
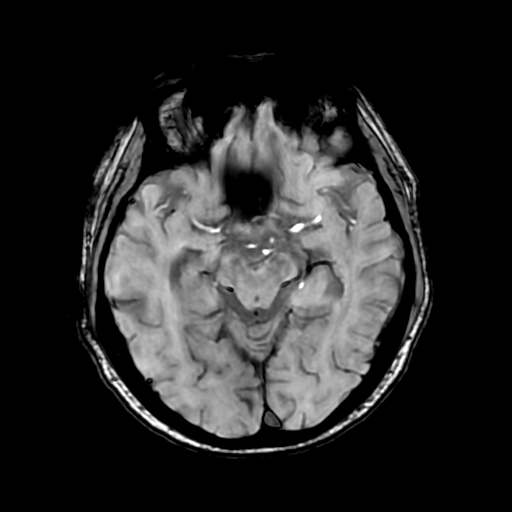
[im 66/116]
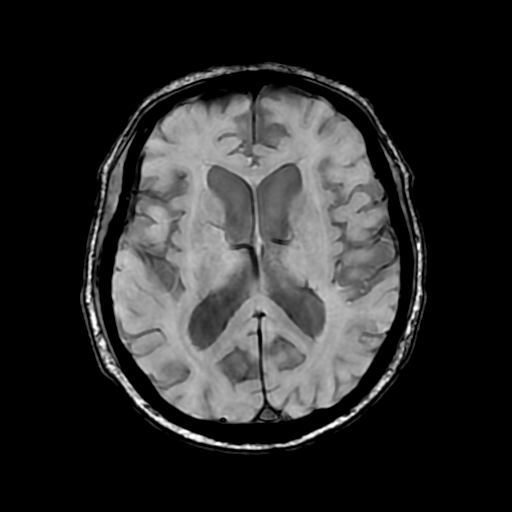

[Series 8: T2 fat-sat · coronal · 4.0mm · 0.35mm/px · 2 of 24 slices shown (1 of 2)]
[im 1/24]
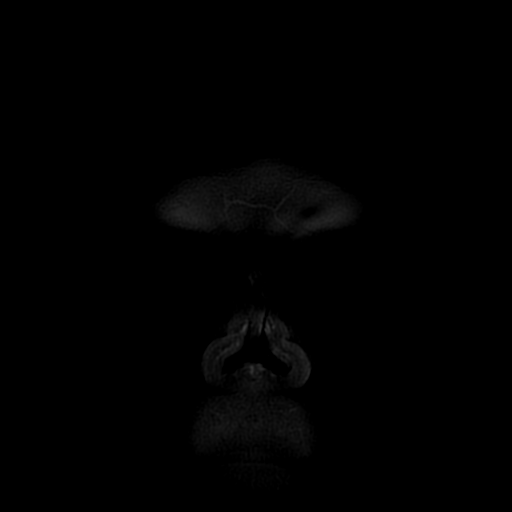
[im 24/24]
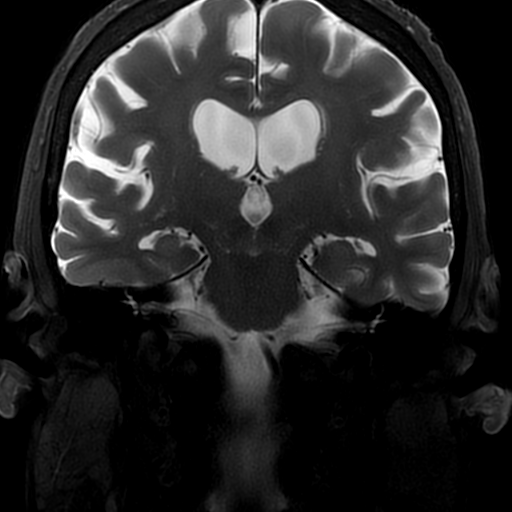

[Series 9: T2 fat-sat · axial · 3.0mm · 0.35mm/px · z∈[-75,-11]mm · 2 of 23 slices shown (2 of 2)]
[im 1/23]
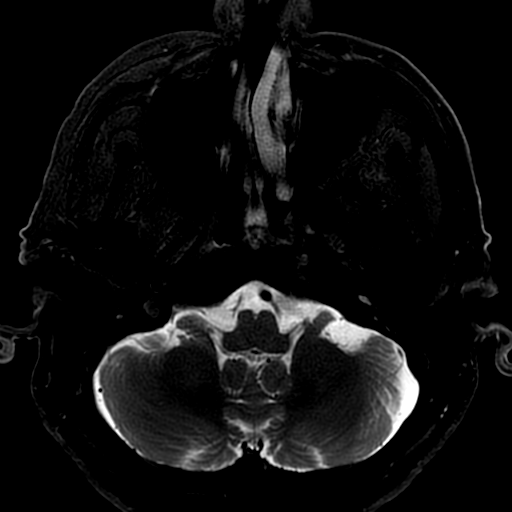
[im 23/23]
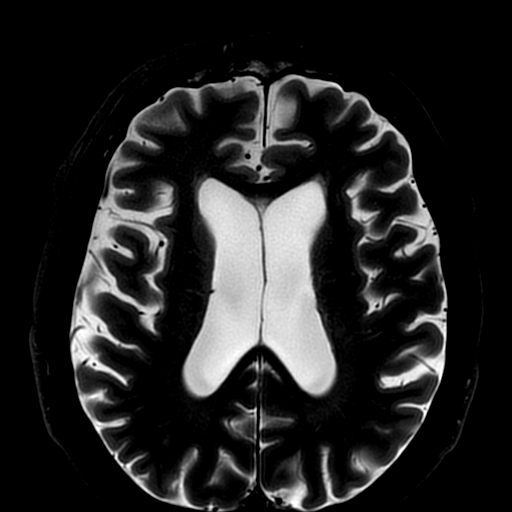

[Series 12: T2 post-contrast · coronal · 5.0mm · 0.39mm/px · 2 of 34 slices shown]
[im 1/34]
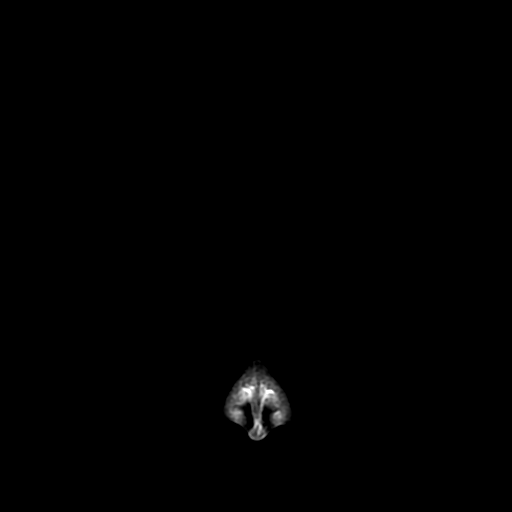
[im 34/34]
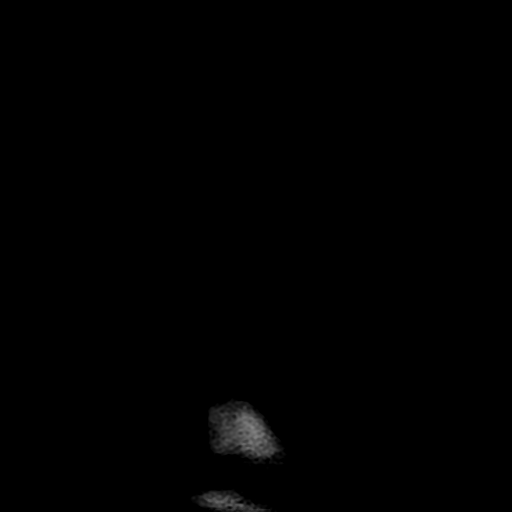

[Series 350: ADC · axial · 3.0mm · 0.94mm/px · z∈[-109,+70]mm · 4 of 61 slices shown]
[im 1/61]
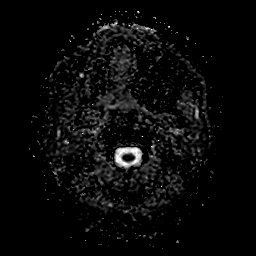
[im 21/61]
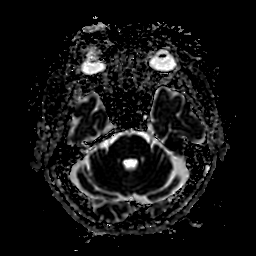
[im 41/61]
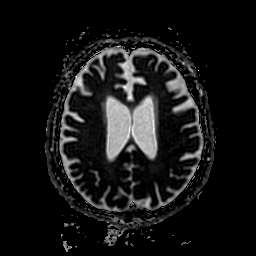
[im 61/61]
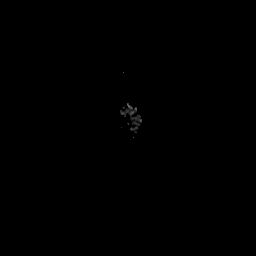

[25 of 48 positions shown; findings below may reference images not displayed]

FINDINGS: MRI HEAD FINDINGS

Brain: No restricted diffusion to suggest acute infarction. No
midline shift, mass effect, evidence of mass lesion,
ventriculomegaly, extra-axial collection or acute intracranial
hemorrhage. Cervicomedullary junction and pituitary are within
normal limits.

Cerebral volume loss appears generalized, no disproportionate areas
of brain atrophy identified. No cortical encephalomalacia or chronic
cerebral blood products identified. Mild for age scattered
nonspecific cerebral white matter T2 and FLAIR hyperintensity. Deep
gray matter nuclei and brainstem appear normal. There is a small
chronic infarct in the right superior cerebellum on series 4, image
10.

Vascular: Major intracranial vascular flow voids are preserved.
Tortuous distal right vertebral artery.

Skull and upper cervical spine: Negative visible cervical spine.
Normal bone marrow signal.

Other: Mastoids are clear. Visible internal auditory structures
appear normal. Scalp soft tissues appear negative.

MRI ORBITS FINDINGS

Orbits: Normal suprasellar cistern. Normal optic chiasm. The
cavernous sinus appears normal.

Symmetric appearance of the pre chiasmatic optic nerves. No abnormal
enhancement of the optic nerves. No signal abnormality within the
substance of either optic nerve.

No intraorbital mass or inflammation. Extraocular muscles and
lacrimal glands appear symmetric and normal.

The globes appear symmetric and normal with no retinal thickening or
abnormal enhancement identified.

Visualized sinuses: Clear aside from trace right maxillary alveolar
recess mucosal thickening.

Soft tissues: Superficial periorbital soft tissues appear normal.
Negative visible deep soft tissue spaces of the face.

Limited intracranial: Asymmetric tortuosity of the left ICA terminus
appears related to dominant left and diminutive or absent right ACA
A1 segments.

No abnormal enhancement of the visible brain parenchyma or dura.
IMPRESSION: 1. Symmetric and normal MRI appearance of the orbits.
2. No acute intracranial abnormality. Mild for age cerebral volume
loss and chronic small vessel disease.

## 2020-04-20 DIAGNOSIS — Z4800 Encounter for change or removal of nonsurgical wound dressing: Secondary | ICD-10-CM | POA: Diagnosis not present

## 2020-04-20 DIAGNOSIS — M85871 Other specified disorders of bone density and structure, right ankle and foot: Secondary | ICD-10-CM | POA: Diagnosis not present

## 2020-04-20 DIAGNOSIS — Z981 Arthrodesis status: Secondary | ICD-10-CM | POA: Diagnosis not present

## 2020-04-20 DIAGNOSIS — M19071 Primary osteoarthritis, right ankle and foot: Secondary | ICD-10-CM | POA: Diagnosis not present

## 2020-04-20 DIAGNOSIS — Z4789 Encounter for other orthopedic aftercare: Secondary | ICD-10-CM | POA: Diagnosis not present

## 2020-06-23 DIAGNOSIS — D485 Neoplasm of uncertain behavior of skin: Secondary | ICD-10-CM | POA: Diagnosis not present

## 2020-06-23 DIAGNOSIS — L821 Other seborrheic keratosis: Secondary | ICD-10-CM | POA: Diagnosis not present

## 2020-06-23 DIAGNOSIS — Z08 Encounter for follow-up examination after completed treatment for malignant neoplasm: Secondary | ICD-10-CM | POA: Diagnosis not present

## 2020-06-23 DIAGNOSIS — D2272 Melanocytic nevi of left lower limb, including hip: Secondary | ICD-10-CM | POA: Diagnosis not present

## 2020-06-23 DIAGNOSIS — Z1283 Encounter for screening for malignant neoplasm of skin: Secondary | ICD-10-CM | POA: Diagnosis not present

## 2020-06-23 DIAGNOSIS — Z86006 Personal history of melanoma in-situ: Secondary | ICD-10-CM | POA: Diagnosis not present

## 2020-07-07 DIAGNOSIS — L988 Other specified disorders of the skin and subcutaneous tissue: Secondary | ICD-10-CM | POA: Diagnosis not present

## 2020-07-07 DIAGNOSIS — D485 Neoplasm of uncertain behavior of skin: Secondary | ICD-10-CM | POA: Diagnosis not present

## 2020-09-08 DIAGNOSIS — Z0000 Encounter for general adult medical examination without abnormal findings: Secondary | ICD-10-CM | POA: Diagnosis not present

## 2020-09-08 DIAGNOSIS — Z23 Encounter for immunization: Secondary | ICD-10-CM | POA: Diagnosis not present

## 2020-09-08 DIAGNOSIS — Z1389 Encounter for screening for other disorder: Secondary | ICD-10-CM | POA: Diagnosis not present

## 2020-09-19 DIAGNOSIS — H2000 Unspecified acute and subacute iridocyclitis: Secondary | ICD-10-CM | POA: Diagnosis not present

## 2020-09-19 DIAGNOSIS — H524 Presbyopia: Secondary | ICD-10-CM | POA: Diagnosis not present

## 2020-09-19 DIAGNOSIS — H2513 Age-related nuclear cataract, bilateral: Secondary | ICD-10-CM | POA: Diagnosis not present

## 2020-10-21 DIAGNOSIS — Z20822 Contact with and (suspected) exposure to covid-19: Secondary | ICD-10-CM | POA: Diagnosis not present

## 2020-12-22 DIAGNOSIS — Z1283 Encounter for screening for malignant neoplasm of skin: Secondary | ICD-10-CM | POA: Diagnosis not present

## 2020-12-22 DIAGNOSIS — L820 Inflamed seborrheic keratosis: Secondary | ICD-10-CM | POA: Diagnosis not present

## 2020-12-22 DIAGNOSIS — Z85820 Personal history of malignant melanoma of skin: Secondary | ICD-10-CM | POA: Diagnosis not present

## 2020-12-22 DIAGNOSIS — L821 Other seborrheic keratosis: Secondary | ICD-10-CM | POA: Diagnosis not present

## 2020-12-22 DIAGNOSIS — D225 Melanocytic nevi of trunk: Secondary | ICD-10-CM | POA: Diagnosis not present

## 2020-12-22 DIAGNOSIS — Z08 Encounter for follow-up examination after completed treatment for malignant neoplasm: Secondary | ICD-10-CM | POA: Diagnosis not present

## 2021-06-05 DIAGNOSIS — D225 Melanocytic nevi of trunk: Secondary | ICD-10-CM | POA: Diagnosis not present

## 2021-06-05 DIAGNOSIS — Z08 Encounter for follow-up examination after completed treatment for malignant neoplasm: Secondary | ICD-10-CM | POA: Diagnosis not present

## 2021-06-05 DIAGNOSIS — Z85820 Personal history of malignant melanoma of skin: Secondary | ICD-10-CM | POA: Diagnosis not present

## 2021-06-05 DIAGNOSIS — Z1283 Encounter for screening for malignant neoplasm of skin: Secondary | ICD-10-CM | POA: Diagnosis not present

## 2021-06-05 DIAGNOSIS — L910 Hypertrophic scar: Secondary | ICD-10-CM | POA: Diagnosis not present

## 2021-07-17 DIAGNOSIS — G479 Sleep disorder, unspecified: Secondary | ICD-10-CM | POA: Diagnosis not present

## 2021-09-25 DIAGNOSIS — H47011 Ischemic optic neuropathy, right eye: Secondary | ICD-10-CM | POA: Diagnosis not present

## 2021-09-25 DIAGNOSIS — H524 Presbyopia: Secondary | ICD-10-CM | POA: Diagnosis not present

## 2021-10-04 DIAGNOSIS — H90A22 Sensorineural hearing loss, unilateral, left ear, with restricted hearing on the contralateral side: Secondary | ICD-10-CM | POA: Diagnosis not present

## 2021-10-04 DIAGNOSIS — H903 Sensorineural hearing loss, bilateral: Secondary | ICD-10-CM | POA: Diagnosis not present

## 2021-10-24 DIAGNOSIS — H903 Sensorineural hearing loss, bilateral: Secondary | ICD-10-CM | POA: Diagnosis not present

## 2021-12-22 DIAGNOSIS — Z20822 Contact with and (suspected) exposure to covid-19: Secondary | ICD-10-CM | POA: Diagnosis not present

## 2021-12-31 DIAGNOSIS — H2000 Unspecified acute and subacute iridocyclitis: Secondary | ICD-10-CM | POA: Diagnosis not present

## 2022-01-07 DIAGNOSIS — H2000 Unspecified acute and subacute iridocyclitis: Secondary | ICD-10-CM | POA: Diagnosis not present

## 2022-01-08 DIAGNOSIS — Z08 Encounter for follow-up examination after completed treatment for malignant neoplasm: Secondary | ICD-10-CM | POA: Diagnosis not present

## 2022-01-08 DIAGNOSIS — D225 Melanocytic nevi of trunk: Secondary | ICD-10-CM | POA: Diagnosis not present

## 2022-01-08 DIAGNOSIS — Z85820 Personal history of malignant melanoma of skin: Secondary | ICD-10-CM | POA: Diagnosis not present

## 2022-01-08 DIAGNOSIS — Z1283 Encounter for screening for malignant neoplasm of skin: Secondary | ICD-10-CM | POA: Diagnosis not present

## 2022-01-17 DIAGNOSIS — H2000 Unspecified acute and subacute iridocyclitis: Secondary | ICD-10-CM | POA: Diagnosis not present

## 2022-01-18 DIAGNOSIS — J0100 Acute maxillary sinusitis, unspecified: Secondary | ICD-10-CM | POA: Diagnosis not present

## 2022-02-05 DIAGNOSIS — H2000 Unspecified acute and subacute iridocyclitis: Secondary | ICD-10-CM | POA: Diagnosis not present

## 2022-04-22 DIAGNOSIS — G4700 Insomnia, unspecified: Secondary | ICD-10-CM | POA: Diagnosis not present

## 2022-04-22 DIAGNOSIS — Z1211 Encounter for screening for malignant neoplasm of colon: Secondary | ICD-10-CM | POA: Diagnosis not present

## 2022-04-22 DIAGNOSIS — R413 Other amnesia: Secondary | ICD-10-CM | POA: Diagnosis not present

## 2022-04-22 DIAGNOSIS — R2689 Other abnormalities of gait and mobility: Secondary | ICD-10-CM | POA: Diagnosis not present

## 2022-04-22 DIAGNOSIS — Z0000 Encounter for general adult medical examination without abnormal findings: Secondary | ICD-10-CM | POA: Diagnosis not present

## 2022-04-22 DIAGNOSIS — R69 Illness, unspecified: Secondary | ICD-10-CM | POA: Diagnosis not present

## 2022-04-22 DIAGNOSIS — Z125 Encounter for screening for malignant neoplasm of prostate: Secondary | ICD-10-CM | POA: Diagnosis not present

## 2022-04-22 DIAGNOSIS — Z23 Encounter for immunization: Secondary | ICD-10-CM | POA: Diagnosis not present

## 2022-04-22 DIAGNOSIS — Z131 Encounter for screening for diabetes mellitus: Secondary | ICD-10-CM | POA: Diagnosis not present

## 2022-04-22 DIAGNOSIS — E7800 Pure hypercholesterolemia, unspecified: Secondary | ICD-10-CM | POA: Diagnosis not present

## 2022-04-22 DIAGNOSIS — Z13220 Encounter for screening for lipoid disorders: Secondary | ICD-10-CM | POA: Diagnosis not present

## 2022-04-26 DIAGNOSIS — R2689 Other abnormalities of gait and mobility: Secondary | ICD-10-CM | POA: Diagnosis not present

## 2022-05-10 DIAGNOSIS — R2689 Other abnormalities of gait and mobility: Secondary | ICD-10-CM | POA: Diagnosis not present

## 2022-05-13 DIAGNOSIS — R69 Illness, unspecified: Secondary | ICD-10-CM | POA: Diagnosis not present

## 2022-05-13 DIAGNOSIS — G3184 Mild cognitive impairment, so stated: Secondary | ICD-10-CM | POA: Diagnosis not present

## 2022-05-15 DIAGNOSIS — M19011 Primary osteoarthritis, right shoulder: Secondary | ICD-10-CM | POA: Diagnosis not present

## 2022-05-15 DIAGNOSIS — M25511 Pain in right shoulder: Secondary | ICD-10-CM | POA: Diagnosis not present

## 2022-05-20 DIAGNOSIS — M7731 Calcaneal spur, right foot: Secondary | ICD-10-CM | POA: Diagnosis not present

## 2022-05-20 DIAGNOSIS — S92301A Fracture of unspecified metatarsal bone(s), right foot, initial encounter for closed fracture: Secondary | ICD-10-CM | POA: Diagnosis not present

## 2022-05-20 DIAGNOSIS — S92351A Displaced fracture of fifth metatarsal bone, right foot, initial encounter for closed fracture: Secondary | ICD-10-CM | POA: Diagnosis not present

## 2022-05-20 DIAGNOSIS — X58XXXA Exposure to other specified factors, initial encounter: Secondary | ICD-10-CM | POA: Diagnosis not present

## 2022-05-20 DIAGNOSIS — Z981 Arthrodesis status: Secondary | ICD-10-CM | POA: Diagnosis not present

## 2022-05-20 DIAGNOSIS — M79671 Pain in right foot: Secondary | ICD-10-CM | POA: Diagnosis not present

## 2022-05-20 DIAGNOSIS — M19071 Primary osteoarthritis, right ankle and foot: Secondary | ICD-10-CM | POA: Diagnosis not present

## 2022-05-22 DIAGNOSIS — G4721 Circadian rhythm sleep disorder, delayed sleep phase type: Secondary | ICD-10-CM | POA: Diagnosis not present

## 2022-05-22 DIAGNOSIS — G4700 Insomnia, unspecified: Secondary | ICD-10-CM | POA: Diagnosis not present

## 2022-05-22 DIAGNOSIS — R682 Dry mouth, unspecified: Secondary | ICD-10-CM | POA: Diagnosis not present

## 2022-06-18 ENCOUNTER — Encounter: Payer: Self-pay | Admitting: *Deleted

## 2022-06-19 ENCOUNTER — Encounter: Payer: Self-pay | Admitting: Psychiatry

## 2022-06-19 ENCOUNTER — Ambulatory Visit: Payer: Medicare HMO | Admitting: Psychiatry

## 2022-06-19 VITALS — BP 109/67 | HR 76 | Ht 72.0 in | Wt 188.0 lb

## 2022-06-19 DIAGNOSIS — R413 Other amnesia: Secondary | ICD-10-CM

## 2022-06-19 DIAGNOSIS — R2689 Other abnormalities of gait and mobility: Secondary | ICD-10-CM

## 2022-06-19 DIAGNOSIS — G629 Polyneuropathy, unspecified: Secondary | ICD-10-CM

## 2022-06-19 DIAGNOSIS — Z79899 Other long term (current) drug therapy: Secondary | ICD-10-CM | POA: Diagnosis not present

## 2022-06-19 NOTE — Patient Instructions (Signed)
MRI brain Blood work Referral for neuropsychological testing  Tasks to improve attention/working memory 1. Good sleep hygiene (7-8 hrs of sleep) 2. Learning a new skill (Painting, Carpentry, Pottery, new language, Knitting). 3.Cognitive exercises (keep a daily journal, Puzzles) 4. Physical exercise and training  (30 min/day X 4 days week) 5. Being on Antidepressant if needed 6.Yoga, Meditation, Tai Chi 7. Decrease alcohol intake 8.Have a clear schedule and structure in daily routine  MIND Diet: The [place] Diet Intervention for Neurodegenerative Delay, or MIND diet, targets the health of the aging brain. Research participants with the highest MIND diet scores had a significantly slower rate of cognitive decline compared with those with the lowest scores. The effects of the MIND diet on cognition showed greater effects than either the Mediterranean or the DASH diet alone.  The healthy items the MIND diet guidelines suggest include:  3+ servings a day of whole grains 1+ servings a day of vegetables (other than green leafy) 6+ servings a week of green leafy vegetables 5+ servings a week of nuts 4+ meals a week of beans 2+ servings a week of berries 2+ meals a week of poultry 1+ meals a week of fish Mainly olive oil if added fat is used  The unhealthy items, which are higher in saturated and trans fat, include: Less than 5 servings a week of pastries and sweets Less than 4 servings a week of red meat (including beef, pork, lamb, and products made from these meats) Less than one serving a week of cheese and fried foods Less than 1 tablespoon a day of butter/stick margarine

## 2022-06-19 NOTE — Progress Notes (Signed)
[facility]  PATIENT: George Blankenship DOB: [birth year]  REFERRING CLINICIAN: Stamey, Girtha Rm, FNP HISTORY FROM: self, wife REASON FOR VISIT: memory loss   HISTORICAL  CHIEF COMPLAINT:  Chief Complaint  Patient presents with   Memory Loss    RM 1 with spouse jackie Pt is well, spouse has been noticing memory and personality concerns for about a yr. Has progressed overtime.  Pt has has also been having imbalance for 83months-yr     HISTORY OF PRESENT ILLNESS:  The patient presents for evaluation of memory loss and personality changes over the past year. It has been progressively worsening over time. 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 Struggles with switching between tasks. Reportedly always had sequencing problems, but now will struggle for days to figure things out at work. He will forget what he ate for lunch some days or forget he ate lunch at all. Wife will remind him of something multiple times and he will tell her she never mentioned it to him. She feels he is more argumentative and feels angry more often.   He has also started to struggle with balance over the past 6 months. Started to notice trouble walking around at night. Then started having trouble at the gym. Denies lightheadedness,  vertigo, pain, or paresthesias. He has started physical therapy for balance.  MRI brain in 2020 done for vision changes showed mild generalized atrophy and a small remote infarct in the right cerebellum.  TBI:  No past history of TBI Stroke:  no past history of stroke Seizures:  no past history of seizures Sleep: Has trouble falling asleep and staying asleep. Uses Trazodone to help him sleep. Rarely snores. Mood: He is more argumentative lately  Functional status: Cooking: wife does the cooking Cleaning: misplaces objects around the house but this is not new for him Shopping: does shopping, will forget to pick things up at the store  or will pick up the wrong things Driving: Has not gotten lost while driving or gotten in any accident Bills: wife handles the finances Medications: Only takes Trazodone for prescription and supplements. Occasionally forgets doses, will use a pill box Forgetting loved ones names?: no Word finding difficulty? no  OTHER MEDICAL CONDITIONS: hx right foot fracture, right cerebellar CVA   REVIEW OF SYSTEMS: Full 14 system review of systems performed and negative with exception of: imbalance, memory loss, personality changes  ALLERGIES: Allergies  Allergen Reactions   Statins Anaphylaxis    Muscle aches Muscle aches   Trazodone     Other reaction(s): Other (See Comments) " feels like I can't swallow"   Lamisil [Terbinafine] Rash    HOME MEDICATIONS: Outpatient Medications Prior to Visit  Medication Sig Dispense Refill   Nutritional Supplements (PROSTATE PO) Take by mouth in the morning and at bedtime.     Omega-3 Fatty Acids (FISH OIL) 1000 MG CAPS Take 1,000 mg by mouth daily.     traZODone (DESYREL) 50 MG tablet Take by mouth at bedtime.     No facility-administered medications prior to visit.    PAST MEDICAL HISTORY: Past Medical History:  Diagnosis Date   ADD (attention deficit disorder)    Arthritis    BPH (benign prostatic hyperplasia)    Depression    DVT (deep venous thrombosis) (HCC)    Hyperlipidemia    Melanoma (HCC)    left arm   Mild neurocognitive disorder    Pulmonary embolism ([place])  PAST SURGICAL HISTORY: Past Surgical History:  Procedure Laterality Date   ANKLE SURGERY     CHOLECYSTECTOMY  1998   MELANOMA EXCISION Left    arm   ROTATOR CUFF REPAIR Right 1993   TONSILLECTOMY     childhood    FAMILY HISTORY: Family History  Problem Relation Age of Onset   Heart attack Father     SOCIAL HISTORY: Social History   Socioeconomic History   Marital status: Married    Spouse name: Not on file   Number of children: 2   Years of education:  Not on file   Highest education level: Not on file  Occupational History    Comment: Gaffer  Tobacco Use   Smoking status: Never   Smokeless tobacco: Never   Tobacco comments:    NEVER USED TOBACCO  Substance and Sexual Activity   Alcohol use: Yes    Comment: 06/18/22 2 drinks 3 days a week   Drug use: No   Sexual activity: Yes  Other Topics Concern   Not on file  Social History Narrative   06/18/22 lives with wife   Caffeine- 2 a day   Social Determinants of Health   Financial Resource Strain: Not on file  Food Insecurity: Not on file  Transportation Needs: Not on file  Physical Activity: Not on file  Stress: Not on file  Social Connections: Not on file  Intimate Partner Violence: Not on file     PHYSICAL EXAM  GENERAL EXAM/CONSTITUTIONAL: Vitals:  Vitals:   06/19/22 1333  BP: 109/67  Pulse: 76  Weight: 188 lb (85.3 kg)  Height: 6' (1.829 m)   Body mass index is 25.5 kg/m. Wt Readings from Last 3 Encounters:  06/19/22 188 lb (85.3 kg)  10/11/19 185 lb (83.9 kg)  11/22/16 196 lb (88.9 kg)   NEUROLOGIC: MENTAL STATUS:     06/19/2022    1:37 PM  Montreal Cognitive Assessment   Visuospatial/ Executive (0/5) 2  Naming (0/3) 3  Attention: Read list of digits (0/2) 2  Attention: Read list of letters (0/1) 1  Attention: Serial 7 subtraction starting at 100 (0/3) 2  Language: Repeat phrase (0/2) 1  Language : Fluency (0/1) 1  Abstraction (0/2) 2  Delayed Recall (0/5) 1  Orientation (0/6) 6  Total 21    CRANIAL NERVE:  2nd, 3rd, 4th, 6th - pupils equal and reactive to light, visual fields full to confrontation, extraocular muscles intact, no nystagmus 5th - facial sensation symmetric 7th - facial strength symmetric 8th - hearing intact 9th - palate elevates symmetrically, uvula midline 11th - shoulder shrug symmetric 12th - tongue protrusion midline  MOTOR:  normal bulk and tone, no cogwheeling, full strength in the BUE, BLE  SENSORY:   Decreased sensation to pinprick over right foot (hx foot fracture, decreased vibration LLE from knee down  COORDINATION:  finger-nose-finger, fine finger movements normal, no tremor  REFLEXES:  deep tendon reflexes present and symmetric  GAIT/STATION:  Normal stride length, slightly wide based gait. +Romberg     DIAGNOSTIC DATA (LABS, IMAGING, TESTING) - I reviewed patient records, labs, notes, testing and imaging myself where available.  Lab Results  Component Value Date   WBC 10.9 (H) 10/19/2019   HGB 15.8 10/19/2019   HCT 48.4 10/19/2019   MCV 90.1 10/19/2019   PLT 195 10/19/2019      Component Value Date/Time   NA 141 10/19/2019 1535   NA 138 11/22/2016 0955   K 4.5 10/19/2019  1535   K 4.4 11/22/2016 0955   CL 105 10/19/2019 1535   CL 103 04/12/2016 1010   CO2 26 10/19/2019 1535   CO2 26 11/22/2016 0955   GLUCOSE 123 (H) 10/19/2019 1535   GLUCOSE 73 11/22/2016 0955   GLUCOSE 75 04/12/2016 1010   BUN 17 10/19/2019 1535   BUN 15.8 11/22/2016 0955   CREATININE 1.36 (H) 10/19/2019 1535   CREATININE 1.2 11/22/2016 0955   CALCIUM 9.4 10/19/2019 1535   CALCIUM 9.5 11/22/2016 0955   PROT 7.4 11/22/2016 0955   ALBUMIN 4.0 11/22/2016 0955   AST 31 11/22/2016 0955   ALT 23 11/22/2016 0955   ALKPHOS 86 11/22/2016 0955   BILITOT 0.69 11/22/2016 0955   GFRNONAA 52 (L) 10/19/2019 1535   GFRAA 60 (L) 10/19/2019 1535     ASSESSMENT AND PLAN  76 y.o. year old male with a history of right foot fracture, remote right cerebellar CVA who presents for presents for evaluation of memory loss and imbalance. MOCA today is 21, suggestive of mild cognitive impairment. Memory has started to impact his work which is concerning to him and his family given his high functioning baseline. Will order brain MRI and check blood work for reversible causes of memory loss. Neuropsychology referral placed to establish a baseline and better characterize his deficits. Previous MRI showed a remote  cerebellar infarct which can impact balance, though this was seen on MRI before he started to have balance issues. Exam is significant for +Romberg and mildly decreased sensation in his lower extremities. This suggests a sensory component to his imbalance. Will send neuropathy labs today.   1. Memory loss   2. Neuropathy       PLAN: - Labs: TSH, B12, A1c, myeloma panel - MRI brain  - Referral for neuropsychological testing - Follow up after testing is complete.   Orders Placed This Encounter  Procedures   MR BRAIN W WO CONTRAST   Vitamin B12   TSH   Hemoglobin A1c   Multiple Myeloma Panel (SPEP&IFE w/QIG)   Ambulatory referral to Neuropsychology    No orders of the defined types were placed in this encounter.   Return in about 8 months (around 02/18/2023).  I spent an average of 60 minutes chart reviewing and counseling the patient, with at least 50% of the time face to face with the patient. General brain health measures discussed, including the importance of regular aerobic exercise. Reviewed safety measures including driving safety.   Genia Harold, MD 06/19/22 4:05 PM  [facility] [address] [place], [place] [address] [phone]

## 2022-06-20 ENCOUNTER — Telehealth: Payer: Self-pay | Admitting: Psychiatry

## 2022-06-20 NOTE — Telephone Encounter (Signed)
Referral sent to [facility] [phone]. ?

## 2022-06-20 NOTE — Telephone Encounter (Signed)
[employer] medicare sent to [facility] they obtain auth  

## 2022-06-24 LAB — MULTIPLE MYELOMA PANEL, SERUM
Albumin SerPl Elph-Mcnc: 3.7 g/dL (ref 2.9–4.4)
Albumin/Glob SerPl: 1.4 (ref 0.7–1.7)
Alpha 1: 0.2 g/dL (ref 0.0–0.4)
Alpha2 Glob SerPl Elph-Mcnc: 0.6 g/dL (ref 0.4–1.0)
B-Globulin SerPl Elph-Mcnc: 0.9 g/dL (ref 0.7–1.3)
Gamma Glob SerPl Elph-Mcnc: 1.1 g/dL (ref 0.4–1.8)
Globulin, Total: 2.8 g/dL (ref 2.2–3.9)
IgA/Immunoglobulin A, Serum: 294 mg/dL (ref 61–437)
IgG (Immunoglobin G), Serum: 1178 mg/dL (ref 603–1613)
IgM (Immunoglobulin M), Srm: 89 mg/dL (ref 15–143)
Total Protein: 6.5 g/dL (ref 6.0–8.5)

## 2022-06-24 LAB — TSH: TSH: 1.04 u[IU]/mL (ref 0.450–4.500)

## 2022-06-24 LAB — VITAMIN B12: Vitamin B-12: 405 pg/mL (ref 232–1245)

## 2022-06-24 LAB — HEMOGLOBIN A1C
Est. average glucose Bld gHb Est-mCnc: 117 mg/dL
Hgb A1c MFr Bld: 5.7 % — ABNORMAL HIGH (ref 4.8–5.6)

## 2022-07-08 DIAGNOSIS — G4700 Insomnia, unspecified: Secondary | ICD-10-CM | POA: Diagnosis not present

## 2022-07-08 DIAGNOSIS — G4721 Circadian rhythm sleep disorder, delayed sleep phase type: Secondary | ICD-10-CM | POA: Diagnosis not present

## 2022-07-26 ENCOUNTER — Encounter: Payer: Self-pay | Admitting: Psychiatry

## 2022-07-26 DIAGNOSIS — G4700 Insomnia, unspecified: Secondary | ICD-10-CM | POA: Diagnosis not present

## 2022-07-26 DIAGNOSIS — F339 Major depressive disorder, recurrent, unspecified: Secondary | ICD-10-CM | POA: Diagnosis not present

## 2022-07-26 DIAGNOSIS — R69 Illness, unspecified: Secondary | ICD-10-CM | POA: Diagnosis not present

## 2022-07-31 ENCOUNTER — Encounter: Payer: Self-pay | Admitting: Psychiatry

## 2022-08-05 DIAGNOSIS — I6782 Cerebral ischemia: Secondary | ICD-10-CM | POA: Diagnosis not present

## 2022-08-05 DIAGNOSIS — H8110 Benign paroxysmal vertigo, unspecified ear: Secondary | ICD-10-CM | POA: Diagnosis not present

## 2022-08-05 DIAGNOSIS — G319 Degenerative disease of nervous system, unspecified: Secondary | ICD-10-CM | POA: Diagnosis not present

## 2022-08-05 DIAGNOSIS — R2681 Unsteadiness on feet: Secondary | ICD-10-CM | POA: Diagnosis not present

## 2022-08-05 DIAGNOSIS — R4189 Other symptoms and signs involving cognitive functions and awareness: Secondary | ICD-10-CM | POA: Diagnosis not present

## 2022-08-07 ENCOUNTER — Telehealth: Payer: Self-pay | Admitting: Psychiatry

## 2022-08-07 ENCOUNTER — Encounter: Payer: Self-pay | Admitting: Psychiatry

## 2022-08-07 NOTE — Telephone Encounter (Signed)
Addressed in TE 08/07/22

## 2022-08-07 NOTE — Telephone Encounter (Signed)
Contacted spouse back, she stated pt has worsen and wanted to be seen sooner. His balance worsen and she took him to the ED and they advised him to FU with neuro. He is also having behavior and memory issues she was very concerned and crying about. Advised her Dr Billey Gosling doesn't have the availability she needed, would she be ok with seeing NP.  Got pt scheduled with Hedwig Morton 08/14/22.

## 2022-08-07 NOTE — Telephone Encounter (Signed)
Pt's wife called stating that the pt was in the ER and they did an MRI there. She would like to discuss with provider and possibly have her husband seen soon due to him worsening. Please advise.

## 2022-08-09 ENCOUNTER — Other Ambulatory Visit: Payer: Self-pay

## 2022-08-09 DIAGNOSIS — N3941 Urge incontinence: Secondary | ICD-10-CM | POA: Diagnosis not present

## 2022-08-09 DIAGNOSIS — R413 Other amnesia: Secondary | ICD-10-CM | POA: Diagnosis not present

## 2022-08-09 DIAGNOSIS — Z888 Allergy status to other drugs, medicaments and biological substances status: Secondary | ICD-10-CM | POA: Diagnosis not present

## 2022-08-09 DIAGNOSIS — H90A22 Sensorineural hearing loss, unilateral, left ear, with restricted hearing on the contralateral side: Secondary | ICD-10-CM | POA: Diagnosis not present

## 2022-08-09 DIAGNOSIS — R42 Dizziness and giddiness: Secondary | ICD-10-CM | POA: Diagnosis not present

## 2022-08-09 DIAGNOSIS — R2689 Other abnormalities of gait and mobility: Secondary | ICD-10-CM | POA: Diagnosis not present

## 2022-08-09 DIAGNOSIS — H903 Sensorineural hearing loss, bilateral: Secondary | ICD-10-CM | POA: Diagnosis not present

## 2022-08-09 DIAGNOSIS — Z883 Allergy status to other anti-infective agents status: Secondary | ICD-10-CM | POA: Diagnosis not present

## 2022-08-13 DIAGNOSIS — R413 Other amnesia: Secondary | ICD-10-CM | POA: Diagnosis not present

## 2022-08-13 DIAGNOSIS — R2689 Other abnormalities of gait and mobility: Secondary | ICD-10-CM | POA: Diagnosis not present

## 2022-08-13 DIAGNOSIS — H903 Sensorineural hearing loss, bilateral: Secondary | ICD-10-CM | POA: Diagnosis not present

## 2022-08-13 DIAGNOSIS — N3941 Urge incontinence: Secondary | ICD-10-CM | POA: Diagnosis not present

## 2022-08-14 ENCOUNTER — Ambulatory Visit: Payer: Medicare HMO | Admitting: Adult Health

## 2022-08-23 DIAGNOSIS — N400 Enlarged prostate without lower urinary tract symptoms: Secondary | ICD-10-CM | POA: Diagnosis not present

## 2022-08-23 DIAGNOSIS — R69 Illness, unspecified: Secondary | ICD-10-CM | POA: Diagnosis not present

## 2022-08-23 DIAGNOSIS — G4700 Insomnia, unspecified: Secondary | ICD-10-CM | POA: Diagnosis not present

## 2022-08-23 DIAGNOSIS — F329 Major depressive disorder, single episode, unspecified: Secondary | ICD-10-CM | POA: Diagnosis not present

## 2022-09-09 DIAGNOSIS — N3941 Urge incontinence: Secondary | ICD-10-CM | POA: Diagnosis not present

## 2022-09-09 DIAGNOSIS — F688 Other specified disorders of adult personality and behavior: Secondary | ICD-10-CM | POA: Diagnosis not present

## 2022-09-09 DIAGNOSIS — R413 Other amnesia: Secondary | ICD-10-CM | POA: Diagnosis not present

## 2022-09-09 DIAGNOSIS — R69 Illness, unspecified: Secondary | ICD-10-CM | POA: Diagnosis not present

## 2022-09-09 DIAGNOSIS — H90A22 Sensorineural hearing loss, unilateral, left ear, with restricted hearing on the contralateral side: Secondary | ICD-10-CM | POA: Diagnosis not present

## 2022-09-09 DIAGNOSIS — R251 Tremor, unspecified: Secondary | ICD-10-CM | POA: Diagnosis not present

## 2022-09-09 DIAGNOSIS — R2689 Other abnormalities of gait and mobility: Secondary | ICD-10-CM | POA: Diagnosis not present

## 2022-09-09 DIAGNOSIS — H903 Sensorineural hearing loss, bilateral: Secondary | ICD-10-CM | POA: Diagnosis not present

## 2022-09-17 DIAGNOSIS — G3184 Mild cognitive impairment, so stated: Secondary | ICD-10-CM | POA: Diagnosis not present

## 2022-09-17 DIAGNOSIS — R2689 Other abnormalities of gait and mobility: Secondary | ICD-10-CM | POA: Diagnosis not present

## 2022-09-17 DIAGNOSIS — R251 Tremor, unspecified: Secondary | ICD-10-CM | POA: Diagnosis not present

## 2022-09-17 DIAGNOSIS — R260 Ataxic gait: Secondary | ICD-10-CM | POA: Diagnosis not present

## 2022-09-17 DIAGNOSIS — R69 Illness, unspecified: Secondary | ICD-10-CM | POA: Diagnosis not present

## 2022-09-17 DIAGNOSIS — Z888 Allergy status to other drugs, medicaments and biological substances status: Secondary | ICD-10-CM | POA: Diagnosis not present

## 2022-09-25 DIAGNOSIS — M47812 Spondylosis without myelopathy or radiculopathy, cervical region: Secondary | ICD-10-CM | POA: Diagnosis not present

## 2022-09-25 DIAGNOSIS — M5030 Other cervical disc degeneration, unspecified cervical region: Secondary | ICD-10-CM | POA: Diagnosis not present

## 2022-09-25 DIAGNOSIS — M4803 Spinal stenosis, cervicothoracic region: Secondary | ICD-10-CM | POA: Diagnosis not present

## 2022-09-25 DIAGNOSIS — M4802 Spinal stenosis, cervical region: Secondary | ICD-10-CM | POA: Diagnosis not present

## 2022-10-01 DIAGNOSIS — H2513 Age-related nuclear cataract, bilateral: Secondary | ICD-10-CM | POA: Diagnosis not present

## 2022-10-01 DIAGNOSIS — H5310 Unspecified subjective visual disturbances: Secondary | ICD-10-CM | POA: Diagnosis not present

## 2022-10-01 DIAGNOSIS — H5203 Hypermetropia, bilateral: Secondary | ICD-10-CM | POA: Diagnosis not present

## 2022-10-07 DIAGNOSIS — R269 Unspecified abnormalities of gait and mobility: Secondary | ICD-10-CM | POA: Diagnosis not present

## 2022-10-07 DIAGNOSIS — D499 Neoplasm of unspecified behavior of unspecified site: Secondary | ICD-10-CM | POA: Diagnosis not present

## 2022-10-07 DIAGNOSIS — R69 Illness, unspecified: Secondary | ICD-10-CM | POA: Diagnosis not present

## 2022-10-07 DIAGNOSIS — R2689 Other abnormalities of gait and mobility: Secondary | ICD-10-CM | POA: Diagnosis not present

## 2022-10-07 DIAGNOSIS — R260 Ataxic gait: Secondary | ICD-10-CM | POA: Diagnosis not present

## 2022-10-14 DIAGNOSIS — R260 Ataxic gait: Secondary | ICD-10-CM | POA: Diagnosis not present

## 2022-10-14 DIAGNOSIS — R2689 Other abnormalities of gait and mobility: Secondary | ICD-10-CM | POA: Diagnosis not present

## 2022-10-14 DIAGNOSIS — R269 Unspecified abnormalities of gait and mobility: Secondary | ICD-10-CM | POA: Diagnosis not present

## 2022-10-14 DIAGNOSIS — R69 Illness, unspecified: Secondary | ICD-10-CM | POA: Diagnosis not present

## 2022-10-17 DIAGNOSIS — H903 Sensorineural hearing loss, bilateral: Secondary | ICD-10-CM | POA: Diagnosis not present

## 2022-10-17 DIAGNOSIS — H90A22 Sensorineural hearing loss, unilateral, left ear, with restricted hearing on the contralateral side: Secondary | ICD-10-CM | POA: Diagnosis not present

## 2022-10-17 DIAGNOSIS — R2689 Other abnormalities of gait and mobility: Secondary | ICD-10-CM | POA: Diagnosis not present

## 2022-10-17 DIAGNOSIS — R251 Tremor, unspecified: Secondary | ICD-10-CM | POA: Diagnosis not present

## 2022-10-17 DIAGNOSIS — R69 Illness, unspecified: Secondary | ICD-10-CM | POA: Diagnosis not present

## 2022-10-17 DIAGNOSIS — R413 Other amnesia: Secondary | ICD-10-CM | POA: Diagnosis not present

## 2022-10-17 DIAGNOSIS — R260 Ataxic gait: Secondary | ICD-10-CM | POA: Diagnosis not present

## 2022-10-17 DIAGNOSIS — N3941 Urge incontinence: Secondary | ICD-10-CM | POA: Diagnosis not present

## 2022-10-21 DIAGNOSIS — G3281 Cerebellar ataxia in diseases classified elsewhere: Secondary | ICD-10-CM | POA: Diagnosis not present

## 2022-10-21 DIAGNOSIS — D499 Neoplasm of unspecified behavior of unspecified site: Secondary | ICD-10-CM | POA: Diagnosis not present

## 2022-10-22 DIAGNOSIS — R269 Unspecified abnormalities of gait and mobility: Secondary | ICD-10-CM | POA: Diagnosis not present

## 2022-10-22 DIAGNOSIS — R2689 Other abnormalities of gait and mobility: Secondary | ICD-10-CM | POA: Diagnosis not present

## 2022-10-22 DIAGNOSIS — R69 Illness, unspecified: Secondary | ICD-10-CM | POA: Diagnosis not present

## 2022-10-25 DIAGNOSIS — G971 Other reaction to spinal and lumbar puncture: Secondary | ICD-10-CM | POA: Diagnosis not present

## 2022-10-25 DIAGNOSIS — R270 Ataxia, unspecified: Secondary | ICD-10-CM | POA: Diagnosis not present

## 2022-10-25 DIAGNOSIS — G3281 Cerebellar ataxia in diseases classified elsewhere: Secondary | ICD-10-CM | POA: Diagnosis not present

## 2022-10-29 DIAGNOSIS — R269 Unspecified abnormalities of gait and mobility: Secondary | ICD-10-CM | POA: Diagnosis not present

## 2022-10-29 DIAGNOSIS — R69 Illness, unspecified: Secondary | ICD-10-CM | POA: Diagnosis not present

## 2022-10-29 DIAGNOSIS — R2689 Other abnormalities of gait and mobility: Secondary | ICD-10-CM | POA: Diagnosis not present

## 2022-11-01 DIAGNOSIS — R69 Illness, unspecified: Secondary | ICD-10-CM | POA: Diagnosis not present

## 2022-11-01 DIAGNOSIS — R269 Unspecified abnormalities of gait and mobility: Secondary | ICD-10-CM | POA: Diagnosis not present

## 2022-11-01 DIAGNOSIS — R2689 Other abnormalities of gait and mobility: Secondary | ICD-10-CM | POA: Diagnosis not present

## 2022-11-04 ENCOUNTER — Encounter: Payer: Self-pay | Admitting: Psychiatry

## 2022-11-05 DIAGNOSIS — R2689 Other abnormalities of gait and mobility: Secondary | ICD-10-CM | POA: Diagnosis not present

## 2022-11-05 DIAGNOSIS — R69 Illness, unspecified: Secondary | ICD-10-CM | POA: Diagnosis not present

## 2022-11-05 DIAGNOSIS — R269 Unspecified abnormalities of gait and mobility: Secondary | ICD-10-CM | POA: Diagnosis not present

## 2022-11-07 DIAGNOSIS — R2689 Other abnormalities of gait and mobility: Secondary | ICD-10-CM | POA: Diagnosis not present

## 2022-11-07 DIAGNOSIS — R69 Illness, unspecified: Secondary | ICD-10-CM | POA: Diagnosis not present

## 2022-11-07 DIAGNOSIS — R269 Unspecified abnormalities of gait and mobility: Secondary | ICD-10-CM | POA: Diagnosis not present

## 2022-11-13 DIAGNOSIS — R269 Unspecified abnormalities of gait and mobility: Secondary | ICD-10-CM | POA: Diagnosis not present

## 2022-11-13 DIAGNOSIS — R2689 Other abnormalities of gait and mobility: Secondary | ICD-10-CM | POA: Diagnosis not present

## 2022-11-13 DIAGNOSIS — R69 Illness, unspecified: Secondary | ICD-10-CM | POA: Diagnosis not present

## 2022-11-21 DIAGNOSIS — Z883 Allergy status to other anti-infective agents status: Secondary | ICD-10-CM | POA: Diagnosis not present

## 2022-11-21 DIAGNOSIS — R260 Ataxic gait: Secondary | ICD-10-CM | POA: Diagnosis not present

## 2022-11-21 DIAGNOSIS — Z888 Allergy status to other drugs, medicaments and biological substances status: Secondary | ICD-10-CM | POA: Diagnosis not present

## 2022-11-21 DIAGNOSIS — R251 Tremor, unspecified: Secondary | ICD-10-CM | POA: Diagnosis not present

## 2022-11-22 DIAGNOSIS — R69 Illness, unspecified: Secondary | ICD-10-CM | POA: Diagnosis not present

## 2022-11-22 DIAGNOSIS — F329 Major depressive disorder, single episode, unspecified: Secondary | ICD-10-CM | POA: Diagnosis not present

## 2022-11-26 DIAGNOSIS — R32 Unspecified urinary incontinence: Secondary | ICD-10-CM | POA: Diagnosis not present

## 2022-11-26 DIAGNOSIS — R4181 Age-related cognitive decline: Secondary | ICD-10-CM | POA: Diagnosis not present

## 2022-11-26 DIAGNOSIS — G912 (Idiopathic) normal pressure hydrocephalus: Secondary | ICD-10-CM | POA: Diagnosis not present

## 2022-11-26 DIAGNOSIS — R2689 Other abnormalities of gait and mobility: Secondary | ICD-10-CM | POA: Diagnosis not present

## 2022-11-26 DIAGNOSIS — R3915 Urgency of urination: Secondary | ICD-10-CM | POA: Diagnosis not present

## 2022-11-26 DIAGNOSIS — R251 Tremor, unspecified: Secondary | ICD-10-CM | POA: Diagnosis not present

## 2022-11-26 DIAGNOSIS — H905 Unspecified sensorineural hearing loss: Secondary | ICD-10-CM | POA: Diagnosis not present

## 2022-12-19 DIAGNOSIS — G9389 Other specified disorders of brain: Secondary | ICD-10-CM | POA: Diagnosis not present

## 2022-12-19 DIAGNOSIS — R29898 Other symptoms and signs involving the musculoskeletal system: Secondary | ICD-10-CM | POA: Diagnosis not present

## 2022-12-19 DIAGNOSIS — G912 (Idiopathic) normal pressure hydrocephalus: Secondary | ICD-10-CM | POA: Diagnosis not present

## 2022-12-23 DIAGNOSIS — Z888 Allergy status to other drugs, medicaments and biological substances status: Secondary | ICD-10-CM | POA: Diagnosis not present

## 2022-12-23 DIAGNOSIS — G912 (Idiopathic) normal pressure hydrocephalus: Secondary | ICD-10-CM | POA: Diagnosis not present

## 2022-12-31 DIAGNOSIS — R69 Illness, unspecified: Secondary | ICD-10-CM | POA: Diagnosis not present

## 2022-12-31 DIAGNOSIS — Z86718 Personal history of other venous thrombosis and embolism: Secondary | ICD-10-CM | POA: Diagnosis not present

## 2022-12-31 DIAGNOSIS — Z7901 Long term (current) use of anticoagulants: Secondary | ICD-10-CM | POA: Diagnosis not present

## 2022-12-31 DIAGNOSIS — G912 (Idiopathic) normal pressure hydrocephalus: Secondary | ICD-10-CM | POA: Diagnosis not present

## 2022-12-31 DIAGNOSIS — I440 Atrioventricular block, first degree: Secondary | ICD-10-CM | POA: Diagnosis not present

## 2022-12-31 DIAGNOSIS — E785 Hyperlipidemia, unspecified: Secondary | ICD-10-CM | POA: Diagnosis not present

## 2023-01-02 DIAGNOSIS — Z87891 Personal history of nicotine dependence: Secondary | ICD-10-CM | POA: Diagnosis not present

## 2023-01-02 DIAGNOSIS — F0390 Unspecified dementia without behavioral disturbance: Secondary | ICD-10-CM | POA: Diagnosis not present

## 2023-01-02 DIAGNOSIS — R69 Illness, unspecified: Secondary | ICD-10-CM | POA: Diagnosis not present

## 2023-01-02 DIAGNOSIS — Z86718 Personal history of other venous thrombosis and embolism: Secondary | ICD-10-CM | POA: Diagnosis not present

## 2023-01-02 DIAGNOSIS — E785 Hyperlipidemia, unspecified: Secondary | ICD-10-CM | POA: Diagnosis not present

## 2023-01-02 DIAGNOSIS — G912 (Idiopathic) normal pressure hydrocephalus: Secondary | ICD-10-CM | POA: Diagnosis not present

## 2023-01-02 DIAGNOSIS — H90A22 Sensorineural hearing loss, unilateral, left ear, with restricted hearing on the contralateral side: Secondary | ICD-10-CM | POA: Diagnosis not present

## 2023-01-02 DIAGNOSIS — N400 Enlarged prostate without lower urinary tract symptoms: Secondary | ICD-10-CM | POA: Diagnosis not present

## 2023-01-02 DIAGNOSIS — Z86711 Personal history of pulmonary embolism: Secondary | ICD-10-CM | POA: Diagnosis not present

## 2023-01-03 DIAGNOSIS — Z982 Presence of cerebrospinal fluid drainage device: Secondary | ICD-10-CM | POA: Diagnosis not present

## 2023-01-03 DIAGNOSIS — G912 (Idiopathic) normal pressure hydrocephalus: Secondary | ICD-10-CM | POA: Diagnosis not present

## 2023-01-21 DIAGNOSIS — L820 Inflamed seborrheic keratosis: Secondary | ICD-10-CM | POA: Diagnosis not present

## 2023-01-21 DIAGNOSIS — Z08 Encounter for follow-up examination after completed treatment for malignant neoplasm: Secondary | ICD-10-CM | POA: Diagnosis not present

## 2023-01-21 DIAGNOSIS — D225 Melanocytic nevi of trunk: Secondary | ICD-10-CM | POA: Diagnosis not present

## 2023-01-21 DIAGNOSIS — L298 Other pruritus: Secondary | ICD-10-CM | POA: Diagnosis not present

## 2023-01-21 DIAGNOSIS — Z85820 Personal history of malignant melanoma of skin: Secondary | ICD-10-CM | POA: Diagnosis not present

## 2023-01-21 DIAGNOSIS — Z1283 Encounter for screening for malignant neoplasm of skin: Secondary | ICD-10-CM | POA: Diagnosis not present

## 2023-02-10 DIAGNOSIS — G912 (Idiopathic) normal pressure hydrocephalus: Secondary | ICD-10-CM | POA: Diagnosis not present

## 2023-02-10 DIAGNOSIS — D181 Lymphangioma, any site: Secondary | ICD-10-CM | POA: Diagnosis not present

## 2023-02-10 DIAGNOSIS — I6200 Nontraumatic subdural hemorrhage, unspecified: Secondary | ICD-10-CM | POA: Diagnosis not present

## 2023-02-11 DIAGNOSIS — R4189 Other symptoms and signs involving cognitive functions and awareness: Secondary | ICD-10-CM | POA: Diagnosis not present

## 2023-02-11 DIAGNOSIS — K5900 Constipation, unspecified: Secondary | ICD-10-CM | POA: Diagnosis not present

## 2023-02-11 DIAGNOSIS — R2689 Other abnormalities of gait and mobility: Secondary | ICD-10-CM | POA: Diagnosis not present

## 2023-02-11 DIAGNOSIS — G912 (Idiopathic) normal pressure hydrocephalus: Secondary | ICD-10-CM | POA: Diagnosis not present

## 2023-02-25 DIAGNOSIS — Z982 Presence of cerebrospinal fluid drainage device: Secondary | ICD-10-CM | POA: Diagnosis not present

## 2023-02-25 DIAGNOSIS — G912 (Idiopathic) normal pressure hydrocephalus: Secondary | ICD-10-CM | POA: Diagnosis not present

## 2023-02-25 DIAGNOSIS — Z86711 Personal history of pulmonary embolism: Secondary | ICD-10-CM | POA: Diagnosis not present

## 2023-02-25 DIAGNOSIS — H905 Unspecified sensorineural hearing loss: Secondary | ICD-10-CM | POA: Diagnosis not present

## 2023-02-26 ENCOUNTER — Ambulatory Visit: Payer: Medicare HMO | Admitting: Psychiatry

## 2023-02-26 DIAGNOSIS — C44212 Basal cell carcinoma of skin of right ear and external auricular canal: Secondary | ICD-10-CM | POA: Diagnosis not present

## 2023-03-19 DIAGNOSIS — J0100 Acute maxillary sinusitis, unspecified: Secondary | ICD-10-CM | POA: Diagnosis not present

## 2023-04-07 DIAGNOSIS — H5310 Unspecified subjective visual disturbances: Secondary | ICD-10-CM | POA: Diagnosis not present

## 2023-04-09 DIAGNOSIS — L905 Scar conditions and fibrosis of skin: Secondary | ICD-10-CM | POA: Diagnosis not present

## 2023-04-09 DIAGNOSIS — L820 Inflamed seborrheic keratosis: Secondary | ICD-10-CM | POA: Diagnosis not present

## 2023-04-09 DIAGNOSIS — Z08 Encounter for follow-up examination after completed treatment for malignant neoplasm: Secondary | ICD-10-CM | POA: Diagnosis not present

## 2023-04-09 DIAGNOSIS — Z85828 Personal history of other malignant neoplasm of skin: Secondary | ICD-10-CM | POA: Diagnosis not present

## 2023-04-11 DIAGNOSIS — E7800 Pure hypercholesterolemia, unspecified: Secondary | ICD-10-CM | POA: Diagnosis not present

## 2023-04-11 DIAGNOSIS — F419 Anxiety disorder, unspecified: Secondary | ICD-10-CM | POA: Diagnosis not present

## 2023-04-11 DIAGNOSIS — G4700 Insomnia, unspecified: Secondary | ICD-10-CM | POA: Diagnosis not present

## 2023-04-24 DIAGNOSIS — R2689 Other abnormalities of gait and mobility: Secondary | ICD-10-CM | POA: Diagnosis not present

## 2023-04-24 DIAGNOSIS — Z974 Presence of external hearing-aid: Secondary | ICD-10-CM | POA: Diagnosis not present

## 2023-04-24 DIAGNOSIS — G912 (Idiopathic) normal pressure hydrocephalus: Secondary | ICD-10-CM | POA: Diagnosis not present

## 2023-04-24 DIAGNOSIS — Z982 Presence of cerebrospinal fluid drainage device: Secondary | ICD-10-CM | POA: Diagnosis not present

## 2023-04-24 DIAGNOSIS — H90A22 Sensorineural hearing loss, unilateral, left ear, with restricted hearing on the contralateral side: Secondary | ICD-10-CM | POA: Diagnosis not present

## 2023-04-24 DIAGNOSIS — H903 Sensorineural hearing loss, bilateral: Secondary | ICD-10-CM | POA: Diagnosis not present

## 2023-04-24 DIAGNOSIS — Z0110 Encounter for examination of ears and hearing without abnormal findings: Secondary | ICD-10-CM | POA: Diagnosis not present

## 2023-04-24 DIAGNOSIS — H90A32 Mixed conductive and sensorineural hearing loss, unilateral, left ear with restricted hearing on the contralateral side: Secondary | ICD-10-CM | POA: Diagnosis not present

## 2023-05-23 DIAGNOSIS — R109 Unspecified abdominal pain: Secondary | ICD-10-CM | POA: Diagnosis not present

## 2023-05-23 DIAGNOSIS — F339 Major depressive disorder, recurrent, unspecified: Secondary | ICD-10-CM | POA: Diagnosis not present

## 2023-05-26 ENCOUNTER — Encounter: Payer: Self-pay | Admitting: Family Medicine

## 2023-05-27 ENCOUNTER — Other Ambulatory Visit: Payer: Self-pay | Admitting: Family Medicine

## 2023-05-27 DIAGNOSIS — R109 Unspecified abdominal pain: Secondary | ICD-10-CM

## 2023-06-04 DIAGNOSIS — R51 Headache with orthostatic component, not elsewhere classified: Secondary | ICD-10-CM | POA: Diagnosis not present

## 2023-06-04 DIAGNOSIS — G912 (Idiopathic) normal pressure hydrocephalus: Secondary | ICD-10-CM | POA: Diagnosis not present

## 2023-06-04 DIAGNOSIS — R4189 Other symptoms and signs involving cognitive functions and awareness: Secondary | ICD-10-CM | POA: Diagnosis not present

## 2023-06-12 ENCOUNTER — Encounter: Payer: Self-pay | Admitting: Family Medicine

## 2023-06-17 ENCOUNTER — Ambulatory Visit
Admission: RE | Admit: 2023-06-17 | Discharge: 2023-06-17 | Disposition: A | Discharge status: Home / Self Care | Payer: Medicare HMO | Source: Ambulatory Visit | Attending: Family Medicine | Admitting: Family Medicine

## 2023-06-17 DIAGNOSIS — K449 Diaphragmatic hernia without obstruction or gangrene: Secondary | ICD-10-CM | POA: Diagnosis not present

## 2023-06-17 DIAGNOSIS — R1030 Lower abdominal pain, unspecified: Secondary | ICD-10-CM | POA: Diagnosis not present

## 2023-06-17 DIAGNOSIS — K760 Fatty (change of) liver, not elsewhere classified: Secondary | ICD-10-CM | POA: Diagnosis not present

## 2023-06-17 DIAGNOSIS — R109 Unspecified abdominal pain: Secondary | ICD-10-CM

## 2023-06-17 MED ORDER — IOPAMIDOL (ISOVUE-300) INJECTION 61%
100.0000 mL | Freq: Once | INTRAVENOUS | Status: AC | PRN
  Administered 2023-06-17: 100 mL via INTRAVENOUS

## 2023-06-25 DIAGNOSIS — G919 Hydrocephalus, unspecified: Secondary | ICD-10-CM | POA: Diagnosis not present

## 2023-06-25 DIAGNOSIS — Z982 Presence of cerebrospinal fluid drainage device: Secondary | ICD-10-CM | POA: Diagnosis not present

## 2023-06-25 DIAGNOSIS — G912 (Idiopathic) normal pressure hydrocephalus: Secondary | ICD-10-CM | POA: Diagnosis not present

## 2023-06-25 DIAGNOSIS — R609 Edema, unspecified: Secondary | ICD-10-CM | POA: Diagnosis not present

## 2023-06-30 DIAGNOSIS — Z125 Encounter for screening for malignant neoplasm of prostate: Secondary | ICD-10-CM | POA: Diagnosis not present

## 2023-06-30 DIAGNOSIS — Z9181 History of falling: Secondary | ICD-10-CM | POA: Diagnosis not present

## 2023-06-30 DIAGNOSIS — R4189 Other symptoms and signs involving cognitive functions and awareness: Secondary | ICD-10-CM | POA: Diagnosis not present

## 2023-06-30 DIAGNOSIS — R519 Headache, unspecified: Secondary | ICD-10-CM | POA: Diagnosis not present

## 2023-06-30 DIAGNOSIS — E782 Mixed hyperlipidemia: Secondary | ICD-10-CM | POA: Diagnosis not present

## 2023-06-30 DIAGNOSIS — F325 Major depressive disorder, single episode, in full remission: Secondary | ICD-10-CM | POA: Diagnosis not present

## 2023-06-30 DIAGNOSIS — Z131 Encounter for screening for diabetes mellitus: Secondary | ICD-10-CM | POA: Diagnosis not present

## 2023-06-30 DIAGNOSIS — Z86010 Personal history of colonic polyps: Secondary | ICD-10-CM | POA: Diagnosis not present

## 2023-06-30 DIAGNOSIS — Z0000 Encounter for general adult medical examination without abnormal findings: Secondary | ICD-10-CM | POA: Diagnosis not present

## 2023-06-30 DIAGNOSIS — G4700 Insomnia, unspecified: Secondary | ICD-10-CM | POA: Diagnosis not present

## 2023-06-30 DIAGNOSIS — N400 Enlarged prostate without lower urinary tract symptoms: Secondary | ICD-10-CM | POA: Diagnosis not present

## 2023-08-15 DIAGNOSIS — X32XXXD Exposure to sunlight, subsequent encounter: Secondary | ICD-10-CM | POA: Diagnosis not present

## 2023-08-15 DIAGNOSIS — L570 Actinic keratosis: Secondary | ICD-10-CM | POA: Diagnosis not present

## 2023-08-18 DIAGNOSIS — G912 (Idiopathic) normal pressure hydrocephalus: Secondary | ICD-10-CM | POA: Diagnosis not present

## 2023-09-16 DIAGNOSIS — F0670 Mild neurocognitive disorder due to known physiological condition without behavioral disturbance: Secondary | ICD-10-CM | POA: Diagnosis not present

## 2023-09-16 DIAGNOSIS — G912 (Idiopathic) normal pressure hydrocephalus: Secondary | ICD-10-CM | POA: Diagnosis not present

## 2023-09-16 DIAGNOSIS — Z982 Presence of cerebrospinal fluid drainage device: Secondary | ICD-10-CM | POA: Diagnosis not present

## 2023-09-24 DIAGNOSIS — Z1211 Encounter for screening for malignant neoplasm of colon: Secondary | ICD-10-CM | POA: Diagnosis not present

## 2023-09-24 DIAGNOSIS — D124 Benign neoplasm of descending colon: Secondary | ICD-10-CM | POA: Diagnosis not present

## 2023-09-24 DIAGNOSIS — K5730 Diverticulosis of large intestine without perforation or abscess without bleeding: Secondary | ICD-10-CM | POA: Diagnosis not present

## 2023-09-24 DIAGNOSIS — D122 Benign neoplasm of ascending colon: Secondary | ICD-10-CM | POA: Diagnosis not present

## 2023-09-26 DIAGNOSIS — X32XXXD Exposure to sunlight, subsequent encounter: Secondary | ICD-10-CM | POA: Diagnosis not present

## 2023-09-26 DIAGNOSIS — D122 Benign neoplasm of ascending colon: Secondary | ICD-10-CM | POA: Diagnosis not present

## 2023-09-26 DIAGNOSIS — L821 Other seborrheic keratosis: Secondary | ICD-10-CM | POA: Diagnosis not present

## 2023-09-26 DIAGNOSIS — D124 Benign neoplasm of descending colon: Secondary | ICD-10-CM | POA: Diagnosis not present

## 2023-09-26 DIAGNOSIS — L570 Actinic keratosis: Secondary | ICD-10-CM | POA: Diagnosis not present

## 2023-10-24 DIAGNOSIS — H4710 Unspecified papilledema: Secondary | ICD-10-CM | POA: Diagnosis not present

## 2023-10-25 DIAGNOSIS — J0140 Acute pansinusitis, unspecified: Secondary | ICD-10-CM | POA: Diagnosis not present

## 2023-10-31 DIAGNOSIS — J0100 Acute maxillary sinusitis, unspecified: Secondary | ICD-10-CM | POA: Diagnosis not present

## 2023-10-31 DIAGNOSIS — F909 Attention-deficit hyperactivity disorder, unspecified type: Secondary | ICD-10-CM | POA: Diagnosis not present

## 2023-10-31 DIAGNOSIS — F339 Major depressive disorder, recurrent, unspecified: Secondary | ICD-10-CM | POA: Diagnosis not present

## 2023-12-05 DIAGNOSIS — F909 Attention-deficit hyperactivity disorder, unspecified type: Secondary | ICD-10-CM | POA: Diagnosis not present

## 2023-12-05 DIAGNOSIS — F339 Major depressive disorder, recurrent, unspecified: Secondary | ICD-10-CM | POA: Diagnosis not present

## 2024-02-25 DIAGNOSIS — Z982 Presence of cerebrospinal fluid drainage device: Secondary | ICD-10-CM | POA: Diagnosis not present

## 2024-02-25 DIAGNOSIS — G912 (Idiopathic) normal pressure hydrocephalus: Secondary | ICD-10-CM | POA: Diagnosis not present

## 2024-02-25 DIAGNOSIS — G919 Hydrocephalus, unspecified: Secondary | ICD-10-CM | POA: Diagnosis not present

## 2024-03-03 DIAGNOSIS — L821 Other seborrheic keratosis: Secondary | ICD-10-CM | POA: Diagnosis not present

## 2024-03-26 DIAGNOSIS — R531 Weakness: Secondary | ICD-10-CM | POA: Diagnosis not present

## 2024-03-26 DIAGNOSIS — Z982 Presence of cerebrospinal fluid drainage device: Secondary | ICD-10-CM | POA: Diagnosis not present

## 2024-03-26 DIAGNOSIS — I4510 Unspecified right bundle-branch block: Secondary | ICD-10-CM | POA: Diagnosis not present

## 2024-03-26 DIAGNOSIS — G912 (Idiopathic) normal pressure hydrocephalus: Secondary | ICD-10-CM | POA: Diagnosis not present

## 2024-03-26 DIAGNOSIS — G9389 Other specified disorders of brain: Secondary | ICD-10-CM | POA: Diagnosis not present

## 2024-03-26 DIAGNOSIS — R262 Difficulty in walking, not elsewhere classified: Secondary | ICD-10-CM | POA: Diagnosis not present

## 2024-03-26 DIAGNOSIS — Z87891 Personal history of nicotine dependence: Secondary | ICD-10-CM | POA: Diagnosis not present

## 2024-03-26 DIAGNOSIS — G629 Polyneuropathy, unspecified: Secondary | ICD-10-CM | POA: Diagnosis not present

## 2024-04-07 DIAGNOSIS — G309 Alzheimer's disease, unspecified: Secondary | ICD-10-CM | POA: Diagnosis not present

## 2024-04-07 DIAGNOSIS — F0280 Dementia in other diseases classified elsewhere without behavioral disturbance: Secondary | ICD-10-CM | POA: Diagnosis not present

## 2024-04-07 DIAGNOSIS — G912 (Idiopathic) normal pressure hydrocephalus: Secondary | ICD-10-CM | POA: Diagnosis not present

## 2024-04-14 DIAGNOSIS — G912 (Idiopathic) normal pressure hydrocephalus: Secondary | ICD-10-CM | POA: Diagnosis not present

## 2024-04-14 DIAGNOSIS — R2689 Other abnormalities of gait and mobility: Secondary | ICD-10-CM | POA: Diagnosis not present

## 2024-04-14 DIAGNOSIS — R531 Weakness: Secondary | ICD-10-CM | POA: Diagnosis not present

## 2024-04-20 DIAGNOSIS — H4710 Unspecified papilledema: Secondary | ICD-10-CM | POA: Diagnosis not present

## 2024-04-20 DIAGNOSIS — H5340 Unspecified visual field defects: Secondary | ICD-10-CM | POA: Diagnosis not present

## 2024-04-29 DIAGNOSIS — G912 (Idiopathic) normal pressure hydrocephalus: Secondary | ICD-10-CM | POA: Diagnosis not present

## 2024-04-29 DIAGNOSIS — H90A22 Sensorineural hearing loss, unilateral, left ear, with restricted hearing on the contralateral side: Secondary | ICD-10-CM | POA: Diagnosis not present

## 2024-04-29 DIAGNOSIS — R2689 Other abnormalities of gait and mobility: Secondary | ICD-10-CM | POA: Diagnosis not present

## 2024-04-29 DIAGNOSIS — Z982 Presence of cerebrospinal fluid drainage device: Secondary | ICD-10-CM | POA: Diagnosis not present

## 2024-04-29 DIAGNOSIS — R4189 Other symptoms and signs involving cognitive functions and awareness: Secondary | ICD-10-CM | POA: Diagnosis not present

## 2024-04-29 DIAGNOSIS — F32A Depression, unspecified: Secondary | ICD-10-CM | POA: Diagnosis not present

## 2024-04-29 DIAGNOSIS — H903 Sensorineural hearing loss, bilateral: Secondary | ICD-10-CM | POA: Diagnosis not present

## 2024-04-29 DIAGNOSIS — G3184 Mild cognitive impairment, so stated: Secondary | ICD-10-CM | POA: Diagnosis not present

## 2024-04-29 DIAGNOSIS — R251 Tremor, unspecified: Secondary | ICD-10-CM | POA: Diagnosis not present

## 2024-04-29 DIAGNOSIS — Z01118 Encounter for examination of ears and hearing with other abnormal findings: Secondary | ICD-10-CM | POA: Diagnosis not present

## 2024-04-29 DIAGNOSIS — R260 Ataxic gait: Secondary | ICD-10-CM | POA: Diagnosis not present

## 2024-05-07 DIAGNOSIS — F0280 Dementia in other diseases classified elsewhere without behavioral disturbance: Secondary | ICD-10-CM | POA: Diagnosis not present

## 2024-05-07 DIAGNOSIS — M4802 Spinal stenosis, cervical region: Secondary | ICD-10-CM | POA: Diagnosis not present

## 2024-05-07 DIAGNOSIS — G912 (Idiopathic) normal pressure hydrocephalus: Secondary | ICD-10-CM | POA: Diagnosis not present

## 2024-05-07 DIAGNOSIS — R260 Ataxic gait: Secondary | ICD-10-CM | POA: Diagnosis not present

## 2024-05-07 DIAGNOSIS — G309 Alzheimer's disease, unspecified: Secondary | ICD-10-CM | POA: Diagnosis not present

## 2024-05-26 DIAGNOSIS — L821 Other seborrheic keratosis: Secondary | ICD-10-CM | POA: Diagnosis not present

## 2024-05-27 DIAGNOSIS — M47812 Spondylosis without myelopathy or radiculopathy, cervical region: Secondary | ICD-10-CM | POA: Diagnosis not present

## 2024-05-27 DIAGNOSIS — M5031 Other cervical disc degeneration,  high cervical region: Secondary | ICD-10-CM | POA: Diagnosis not present

## 2024-05-27 DIAGNOSIS — R292 Abnormal reflex: Secondary | ICD-10-CM | POA: Diagnosis not present

## 2024-05-27 DIAGNOSIS — M4802 Spinal stenosis, cervical region: Secondary | ICD-10-CM | POA: Diagnosis not present

## 2024-05-27 DIAGNOSIS — G912 (Idiopathic) normal pressure hydrocephalus: Secondary | ICD-10-CM | POA: Diagnosis not present

## 2024-05-27 DIAGNOSIS — Z982 Presence of cerebrospinal fluid drainage device: Secondary | ICD-10-CM | POA: Diagnosis not present

## 2024-05-27 DIAGNOSIS — R413 Other amnesia: Secondary | ICD-10-CM | POA: Diagnosis not present

## 2024-05-27 DIAGNOSIS — R260 Ataxic gait: Secondary | ICD-10-CM | POA: Diagnosis not present

## 2024-07-22 DIAGNOSIS — E7800 Pure hypercholesterolemia, unspecified: Secondary | ICD-10-CM | POA: Diagnosis not present

## 2024-07-22 DIAGNOSIS — R413 Other amnesia: Secondary | ICD-10-CM | POA: Diagnosis not present

## 2024-07-22 DIAGNOSIS — Z6823 Body mass index (BMI) 23.0-23.9, adult: Secondary | ICD-10-CM | POA: Diagnosis not present

## 2024-07-22 DIAGNOSIS — Z0000 Encounter for general adult medical examination without abnormal findings: Secondary | ICD-10-CM | POA: Diagnosis not present

## 2024-08-09 DIAGNOSIS — G912 (Idiopathic) normal pressure hydrocephalus: Secondary | ICD-10-CM | POA: Diagnosis not present

## 2024-08-09 DIAGNOSIS — G309 Alzheimer's disease, unspecified: Secondary | ICD-10-CM | POA: Diagnosis not present

## 2024-08-09 DIAGNOSIS — F0280 Dementia in other diseases classified elsewhere without behavioral disturbance: Secondary | ICD-10-CM | POA: Diagnosis not present

## 2024-08-25 DIAGNOSIS — G3184 Mild cognitive impairment, so stated: Secondary | ICD-10-CM | POA: Diagnosis not present

## 2024-08-25 DIAGNOSIS — R413 Other amnesia: Secondary | ICD-10-CM | POA: Diagnosis not present

## 2024-10-06 DIAGNOSIS — R413 Other amnesia: Secondary | ICD-10-CM | POA: Diagnosis not present

## 2024-10-06 DIAGNOSIS — Z23 Encounter for immunization: Secondary | ICD-10-CM | POA: Diagnosis not present

## 2024-10-06 DIAGNOSIS — G3184 Mild cognitive impairment, so stated: Secondary | ICD-10-CM | POA: Diagnosis not present

## 2024-10-18 DIAGNOSIS — N400 Enlarged prostate without lower urinary tract symptoms: Secondary | ICD-10-CM | POA: Diagnosis not present

## 2024-10-18 DIAGNOSIS — R972 Elevated prostate specific antigen [PSA]: Secondary | ICD-10-CM | POA: Diagnosis not present
